# Patient Record
Sex: Male | Born: 1970 | Race: White | Hispanic: No | Marital: Married | Smoking: Never smoker
Health system: Southern US, Community
[De-identification: ages and names within clinical notes are randomized; demographics above are authoritative.]

## PROBLEM LIST (undated history)

## (undated) DIAGNOSIS — Q210 Ventricular septal defect: Secondary | ICD-10-CM

## (undated) DIAGNOSIS — G4733 Obstructive sleep apnea (adult) (pediatric): Secondary | ICD-10-CM

## (undated) HISTORY — DX: Ventricular septal defect: Q21.0

## (undated) HISTORY — DX: Obstructive sleep apnea (adult) (pediatric): G47.33

## (undated) HISTORY — PX: OTHER SURGICAL HISTORY: SHX169

---

## 2013-08-27 ENCOUNTER — Encounter: Payer: Self-pay | Admitting: General Surgery

## 2013-08-27 DIAGNOSIS — G4733 Obstructive sleep apnea (adult) (pediatric): Secondary | ICD-10-CM | POA: Insufficient documentation

## 2013-08-31 ENCOUNTER — Ambulatory Visit (INDEPENDENT_AMBULATORY_CARE_PROVIDER_SITE_OTHER): Payer: BC Managed Care – PPO | Admitting: Cardiology

## 2013-08-31 ENCOUNTER — Encounter: Payer: Self-pay | Admitting: Cardiology

## 2013-08-31 VITALS — BP 129/77 | HR 80 | Ht 66.0 in | Wt 208.0 lb

## 2013-08-31 DIAGNOSIS — G4733 Obstructive sleep apnea (adult) (pediatric): Secondary | ICD-10-CM

## 2013-08-31 DIAGNOSIS — Q210 Ventricular septal defect: Secondary | ICD-10-CM | POA: Insufficient documentation

## 2013-08-31 DIAGNOSIS — Z8774 Personal history of (corrected) congenital malformations of heart and circulatory system: Secondary | ICD-10-CM

## 2013-08-31 DIAGNOSIS — E669 Obesity, unspecified: Secondary | ICD-10-CM

## 2013-08-31 DIAGNOSIS — Z9889 Other specified postprocedural states: Secondary | ICD-10-CM

## 2013-08-31 NOTE — Progress Notes (Signed)
[address] [place], [place]  [address] Phone: [phone]([phone] Fax:  [phone](336) [phone]  Date:  08/31/2013   ID:  Jackson Blackburn, DOB 1970/12/17, MRN [id number]  PCP:  Darrow BussingKOIRALA,DIBAS, MD  Sleep Medicine:  Armanda Magicraci Turner, MD     History of Present Illness: Jackson Blackburn is a 43 y.o. male with a history of OSA who presents today for followup.  He is doing well.  He tolerates his BiPAP ASV device.  He tolerates the mask and feels the pressure is adequate except when he starts to lay down he feels the pressure is not high like it is during the night.  He is on a RAMP protocol.  He has not felt as rested in the am and as felt sleepy during the day recently but has been staying up later.  His download today showed an AHI of 1.3/hr and 98% compliance in using more than 4 hours nightly. He has a history of VSD s/p repair as a child and had an echo several years ago that showed no residual.   Wt Readings from Last 3 Encounters:  08/31/13 208 lb (94.348 kg)     Past Medical History  Diagnosis Date  . VSD (ventricular septal defect)     Congenital  . OSA (obstructive sleep apnea)     Severe    Current Outpatient Prescriptions  Medication Sig Dispense Refill  . acetaminophen (TYLENOL) 325 MG tablet Take 650 mg by mouth every 6 (six) hours as needed (as needed).      . Multiple Vitamin (MULTIVITAMIN) tablet Take 1 tablet by mouth daily.      . Naproxen Sodium (ALEVE) 220 MG CAPS Take by mouth as needed.       No current facility-administered medications for this visit.    Allergies:    Allergies  Allergen Reactions  . Penicillins     unknown but can take amox    Social History:  The patient  reports that he has never smoked. He does not have any smokeless tobacco history on file. He reports that he drinks alcohol. He reports that he does not use illicit drugs.   Family History:  The patient's family history includes Hypertension in his father.   ROS:  Please see the history of  present illness.      All other systems reviewed and negative.   PHYSICAL EXAM: VS:  BP 129/77  Pulse 80  Ht 5\' 6"  (1.676 m)  Wt 208 lb (94.348 kg)  BMI 33.59 kg/m2 Well nourished, well developed, in no acute distress HEENT: normal Neck: no JVD Cardiac:  normal S1, S2; RRR; no murmur Lungs:  clear to auscultation bilaterally, no wheezing, rhonchi or rales Abd: soft, nontender, no hepatomegaly Ext: no edema Skin: warm and dry Neuro:  CNs 2-12 intact, no focal abnormalities noted       ASSESSMENT AND PLAN:  1. Severe OSA on BiPAP ASV and tolerating well.  Download showed adequate AHI on current settings and good compliance 2. Obesity - he has started exercising more on the treadmill.  He is running 2 miles for about 40 minutes at least twice weekly.  I encouraged him to try to increase to 5 days weekly. 3. Daytime sleepiness which is due to not going to bed on time at night.  His AHI is good indicating that his sleep apnea is adequately titrated and not the cause of daytime sleepiness.   4. History of VSD s/p repair with residual  left to right shunt by echo 10/10  Followup with me in  months  Signed, Armanda Magicraci Turner, MD 08/31/2013 10:41 AM

## 2013-08-31 NOTE — Patient Instructions (Signed)
Your physician recommends that you continue on your current medications as directed. Please refer to the Current Medication list given to you today.  Your physician wants you to follow-up in: 6 months with Dr Turner You will receive a reminder letter in the mail two months in advance. If you don't receive a letter, please call our office to schedule the follow-up appointment.  

## 2013-12-29 ENCOUNTER — Encounter: Payer: Self-pay | Admitting: Cardiology

## 2014-01-11 ENCOUNTER — Encounter: Payer: Self-pay | Admitting: Cardiology

## 2014-03-29 ENCOUNTER — Encounter: Payer: Self-pay | Admitting: Cardiology

## 2014-03-29 ENCOUNTER — Ambulatory Visit (INDEPENDENT_AMBULATORY_CARE_PROVIDER_SITE_OTHER): Payer: BC Managed Care – PPO | Admitting: Cardiology

## 2014-03-29 VITALS — BP 128/80 | HR 63 | Ht 68.0 in | Wt 206.0 lb

## 2014-03-29 DIAGNOSIS — E669 Obesity, unspecified: Secondary | ICD-10-CM

## 2014-03-29 DIAGNOSIS — G4733 Obstructive sleep apnea (adult) (pediatric): Secondary | ICD-10-CM

## 2014-03-29 DIAGNOSIS — R079 Chest pain, unspecified: Secondary | ICD-10-CM

## 2014-03-29 DIAGNOSIS — Q210 Ventricular septal defect: Secondary | ICD-10-CM

## 2014-03-29 NOTE — Progress Notes (Signed)
[address] [place], [place]  [address] Phone: [phone] Fax:  [phone]  Date:  03/29/2014   ID:  Jackson Blackburn, DOB February 01, 1971, MRN [id number]  PCP:  Darrow BussingKOIRALA,DIBAS, MD  Cardiologist:  Armanda Magicraci Turner, MD    History of Present Illness: Jackson JulyMichael A Glynn is a 43 y.o. male with a history of OSA who presents today for followup. He is doing well. He tolerates his BiPAP. He tolerates the nasal pillow mask and feels the pressure is adequate.  His download today showed an AHI of 1.2/hr and 99% compliance in using more than 4 hours nightly on 12/8cm H2O.  He feels rested in the am if he has gone to bed on time.   He has a history of VSD s/p repair as a child and had an echo several years ago that showed no residual. He has been having some chest discomfort that is associated with jaw pain.  The chest pain is located in the center of his chest and left chest and radiates into his back and jaw.  It can last up to 30 minutes.  It is nonexertional.  He denies any SOB or diaphoresis but occasionally has some nausea.  He walks on the treadmill without any problems.      Wt Readings from Last 3 Encounters:  03/29/14 206 lb (93.441 kg)  08/31/13 208 lb (94.348 kg)     Past Medical History  Diagnosis Date  . VSD (ventricular septal defect)     Congenital  . OSA (obstructive sleep apnea)     Severe    Current Outpatient Prescriptions  Medication Sig Dispense Refill  . acetaminophen (TYLENOL) 325 MG tablet Take 650 mg by mouth every 6 (six) hours as needed (as needed).    Marland Kitchen. aspirin 81 MG tablet Take 81 mg by mouth daily.    . Multiple Vitamin (MULTIVITAMIN) tablet Take 1 tablet by mouth daily.    . Naproxen Sodium (ALEVE) 220 MG CAPS Take by mouth as needed.     No current facility-administered medications for this visit.    Allergies:    Allergies  Allergen Reactions  . Penicillins     unknown but can take amox    Social History:  The patient  reports that he has never  smoked. He does not have any smokeless tobacco history on file. He reports that he drinks alcohol. He reports that he does not use illicit drugs.   Family History:  The patient's family history includes CVA in his father; Coronary artery disease in his father and paternal uncle; Heart disease in his father and paternal uncle; Hypertension in his father.   ROS:  Please see the history of present illness.      All other systems reviewed and negative.   PHYSICAL EXAM: VS:  BP 128/80 mmHg  Pulse 63  Ht 5\' 8"  (1.727 m)  Wt 206 lb (93.441 kg)  BMI 31.33 kg/m2 Well nourished, well developed, in no acute distress HEENT: normal Neck: no JVD Cardiac:  normal S1, S2; RRR; no murmur Lungs:  clear to auscultation bilaterally, no wheezing, rhonchi or rales Abd: soft, nontender, no hepatomegaly Ext: no edema Skin: warm and dry Neuro:  CNs 2-12 intact, no focal abnormalities noted  EKG:  NSR with sinus arrhythmia with rSR'     ASSESSMENT AND PLAN:  1. Severe OSA on BiPAP and tolerating well. Download showed adequate AHI on current settings and good compliance 2. Obesity - He is exercising  on the treadmill 3. History of VSD s/p repair with residual left to right shunt by echo 10/10 4. Chest pain which is somewhat atypical in that is occurs only at rest and not when he is on the treadmill but it does radiate to his back and jaw.  This also could be GERD with esophageal reflux.  Followup with me in months 6 months   Signed, Armanda Magicraci Turner, MD [facility] 03/29/2014 8:41 AM

## 2014-03-29 NOTE — Patient Instructions (Signed)
Your physician recommends that you continue on your current medications as directed. Please refer to the Current Medication list given to you today.  Your physician has requested that you have an exercise tolerance test. For further information please visit [email or web address]. Please also follow instruction sheet, as given.  Your physician wants you to follow-up in: 6 months with Dr. Turner. You will receive a reminder letter in the mail two months in advance. If you don't receive a letter, please call our office to schedule the follow-up appointment.  

## 2014-05-04 ENCOUNTER — Encounter: Payer: Self-pay | Admitting: *Deleted

## 2014-05-05 ENCOUNTER — Ambulatory Visit (INDEPENDENT_AMBULATORY_CARE_PROVIDER_SITE_OTHER): Payer: BC Managed Care – PPO | Admitting: Physician Assistant

## 2014-05-05 DIAGNOSIS — R079 Chest pain, unspecified: Secondary | ICD-10-CM

## 2014-05-05 NOTE — Progress Notes (Signed)
Exercise Treadmill Test  Pre-Exercise Testing Evaluation Rhythm: normal sinus  Rate: 78 bpm     Test  Exercise Tolerance Test Ordering MD: Armanda Magicraci Turner, MD  Interpreting MD: Tereso NewcomerScott Hendryx Ricke, PA-C  Unique Test No: 1  Treadmill:  1  Indication for ETT: chest pain - rule out ischemia  Contraindication to ETT: No   Stress Modality: exercise - treadmill  Cardiac Imaging Performed: non   Protocol: standard Bruce - maximal  Max BP:  170/81  Max MPHR (bpm):  175 85% MPR (bpm):  149  MPHR obtained (bpm):  166 % MPHR obtained:  94  Reached 85% MPHR (min:sec):  9:34 Total Exercise Time (min-sec):  10:30  Workload in METS:  12.5 Borg Scale: 16  Reason ETT Terminated:  desired heart rate attained    ST Segment Analysis At Rest: non-specific ST segment slurring With Exercise: non-specific ST changes  Other Information Arrhythmia:  No Angina during ETT:  absent (0) Quality of ETT:  diagnostic  ETT Interpretation:  normal - no evidence of ischemia by ST analysis  Comments: Good exercise capacity. No chest pain. Normal BP response to exercise. No ST changes to suggest ischemia.   Recommendations: FU with Dr. Armanda Magicraci Turner as planned. Signed,  Tereso NewcomerScott Keyshia Orwick, PA-C   05/05/2014 10:56 AM

## 2014-07-29 ENCOUNTER — Encounter: Payer: Self-pay | Admitting: Cardiology

## 2014-10-20 NOTE — Progress Notes (Signed)
Cardiology Office Note   Date:  10/21/2014   ID:  Jackson JulyMichael A Saxe, DOB 10/25/1970, MRN [id number]  PCP:  Darrow BussingKOIRALA,DIBAS, MD    Chief Complaint  Patient presents with  . Follow-up    OSA      History of Present Illness: Jackson Blackburn is a 44 y.o. male with a history of OSA who presents today for followup. He is doing well. He tolerates his BiPAP. He tolerates the nasal pillow mask and feels the pressure is adequate.He has been waking up recently with dry mouth and thinks his chin strap is too loose and is getting another one.  He sleeps on his back for the most part.   His download today showed an AHI of 1.4/hr and 99% compliance in using more than 4 hours nightly on 12/8cm H2O. He feels rested in the am if he has gone to bed on time. He has a history of VSD s/p repair as a child and had an echo several years ago that showed no residual.  He ran his first 5K and has noticed that he has been getting some sharp leg pain in his calf in both legs.  He denies any chest pain but had some SOB when running the 5K. He denies any dizziness, palpitations or syncope.    Past Medical History  Diagnosis Date  . VSD (ventricular septal defect)     Congenital  . OSA (obstructive sleep apnea)     Severe    Past Surgical History  Procedure Laterality Date  . No surgical hx       Current Outpatient Prescriptions  Medication Sig Dispense Refill  . acetaminophen (TYLENOL) 325 MG tablet Take 650 mg by mouth every 6 (six) hours as needed (as needed).    Marland Kitchen. aspirin 81 MG tablet Take 81 mg by mouth daily.    . Multiple Vitamin (MULTIVITAMIN) tablet Take 1 tablet by mouth daily.    . Naproxen Sodium (ALEVE) 220 MG CAPS Take by mouth as needed.     No current facility-administered medications for this visit.    Allergies:   Penicillins    Social History:  The patient  reports that he has never smoked. He does not have any smokeless tobacco history on file. He reports that  he drinks alcohol. He reports that he does not use illicit drugs.   Family History:  The patient's family history includes CVA in his father; Coronary artery disease in his father and paternal uncle; Heart disease in his father and paternal uncle; Hypertension in his father.    ROS:  Please see the history of present illness.   Otherwise, review of systems are positive for none.   All other systems are reviewed and negative.    PHYSICAL EXAM: VS:  BP 120/70 mmHg  Pulse 89  Ht 5\' 8"  (1.727 m)  Wt 208 lb (94.348 kg)  BMI 31.63 kg/m2  SpO2 93% , BMI Body mass index is 31.63 kg/(m^2). GEN: Well nourished, well developed, in no acute distress HEENT: normal Neck: no JVD, carotid bruits, or masses Cardiac: RRR; no murmurs, rubs, or gallops,no edema  Respiratory:  clear to auscultation bilaterally, normal work of breathing GI: soft, nontender, nondistended, + BS MS: no deformity or atrophy Skin: warm and dry, no rash Neuro:  Strength and sensation are intact Psych: euthymic mood, full affect   EKG:  EKG is not ordered  today.    Recent Labs: No results found for requested labs within last 365 days.    Lipid Panel No results found for: CHOL, TRIG, HDL, CHOLHDL, VLDL, LDLCALC, LDLDIRECT    Wt Readings from Last 3 Encounters:  10/21/14 208 lb (94.348 kg)  03/29/14 206 lb (93.441 kg)  08/31/13 208 lb (94.348 kg)     ASSESSMENT AND PLAN:  1. Severe OSA on BiPAP and tolerating well. Download showed adequate AHI on current settings and good compliance 2. Obesity - He is exercising on the treadmill 3. History of VSD s/p repair with residual left to right shunt by echo 10/10   4.       LE cramps in legs with exertion ? Claudication.  Also swelling in his calfs.  I will check bilateral LE venous and arterial dopplers   Current medicines are reviewed at length with the patient today.  The patient does not have concerns regarding medicines.  The following changes have been made:   no change  Labs/ tests ordered today: See above Assessment and Plan No orders of the defined types were placed in this encounter.     Disposition:   FU with me in 1 year  Signed, Quintella Reichert, MD  10/21/2014 8:54 AM    [facility] [address] [place], [place], [place]  [address] Phone: [phone]; Fax: [phone]

## 2014-10-21 ENCOUNTER — Ambulatory Visit (INDEPENDENT_AMBULATORY_CARE_PROVIDER_SITE_OTHER): Payer: BLUE CROSS/BLUE SHIELD | Admitting: Cardiology

## 2014-10-21 ENCOUNTER — Encounter: Payer: Self-pay | Admitting: Cardiology

## 2014-10-21 VITALS — BP 120/70 | HR 89 | Ht 68.0 in | Wt 208.0 lb

## 2014-10-21 DIAGNOSIS — E669 Obesity, unspecified: Secondary | ICD-10-CM

## 2014-10-21 DIAGNOSIS — Q210 Ventricular septal defect: Secondary | ICD-10-CM

## 2014-10-21 DIAGNOSIS — G4733 Obstructive sleep apnea (adult) (pediatric): Secondary | ICD-10-CM | POA: Diagnosis not present

## 2014-10-21 DIAGNOSIS — Z9889 Other specified postprocedural states: Secondary | ICD-10-CM

## 2014-10-21 DIAGNOSIS — I739 Peripheral vascular disease, unspecified: Secondary | ICD-10-CM

## 2014-10-21 DIAGNOSIS — M79606 Pain in leg, unspecified: Secondary | ICD-10-CM | POA: Insufficient documentation

## 2014-10-21 DIAGNOSIS — Z8774 Personal history of (corrected) congenital malformations of heart and circulatory system: Secondary | ICD-10-CM

## 2014-10-21 NOTE — Patient Instructions (Signed)
Medication Instructions:  Your physician recommends that you continue on your current medications as directed. Please refer to the Current Medication list given to you today.   Labwork: None  Testing/Procedures: Dr. Mayford Knifeurner recommends you have a lower extremity arterial duplex.  Dr. Mayford Knifeurner recommends you have a lower extremity venous duplex.  Follow-Up: Your physician wants you to follow-up in: 1 year with Dr. Mayford Knifeurner. You will receive a reminder letter in the mail two months in advance. If you don't receive a letter, please call our office to schedule the follow-up appointment.   Any Other Special Instructions Will Be Listed Below (If Applicable).

## 2014-10-26 ENCOUNTER — Other Ambulatory Visit: Payer: Self-pay | Admitting: Cardiology

## 2014-10-26 DIAGNOSIS — M79606 Pain in leg, unspecified: Secondary | ICD-10-CM

## 2014-10-26 DIAGNOSIS — R609 Edema, unspecified: Secondary | ICD-10-CM

## 2014-11-01 ENCOUNTER — Ambulatory Visit (HOSPITAL_COMMUNITY): Payer: BLUE CROSS/BLUE SHIELD | Attending: Cardiology

## 2014-11-01 DIAGNOSIS — M79604 Pain in right leg: Secondary | ICD-10-CM | POA: Diagnosis not present

## 2014-11-01 DIAGNOSIS — M79606 Pain in leg, unspecified: Secondary | ICD-10-CM

## 2014-11-01 DIAGNOSIS — M79605 Pain in left leg: Secondary | ICD-10-CM | POA: Diagnosis not present

## 2014-11-01 DIAGNOSIS — R609 Edema, unspecified: Secondary | ICD-10-CM | POA: Diagnosis not present

## 2014-11-02 ENCOUNTER — Ambulatory Visit (HOSPITAL_COMMUNITY): Payer: BLUE CROSS/BLUE SHIELD | Attending: Cardiology

## 2014-11-02 DIAGNOSIS — I739 Peripheral vascular disease, unspecified: Secondary | ICD-10-CM

## 2014-11-02 DIAGNOSIS — M79662 Pain in left lower leg: Secondary | ICD-10-CM | POA: Diagnosis not present

## 2014-11-02 DIAGNOSIS — M79606 Pain in leg, unspecified: Secondary | ICD-10-CM | POA: Diagnosis not present

## 2014-11-02 DIAGNOSIS — M79661 Pain in right lower leg: Secondary | ICD-10-CM | POA: Insufficient documentation

## 2014-11-03 ENCOUNTER — Encounter: Payer: Self-pay | Admitting: Cardiology

## 2014-11-07 ENCOUNTER — Other Ambulatory Visit: Payer: Self-pay | Admitting: Cardiology

## 2014-11-07 DIAGNOSIS — I739 Peripheral vascular disease, unspecified: Secondary | ICD-10-CM

## 2014-11-08 ENCOUNTER — Ambulatory Visit (HOSPITAL_COMMUNITY): Payer: BLUE CROSS/BLUE SHIELD | Attending: Cardiology

## 2014-11-08 DIAGNOSIS — I739 Peripheral vascular disease, unspecified: Secondary | ICD-10-CM | POA: Insufficient documentation

## 2014-11-17 ENCOUNTER — Telehealth: Payer: Self-pay

## 2014-11-17 NOTE — Telephone Encounter (Signed)
Informed patient of results and verbal understanding expressed.   Message sent to scheduling to make appointment with Dr. Kirke CorinArida or Dr. Allyson SabalBerry.

## 2014-11-17 NOTE — Telephone Encounter (Signed)
-----   Message from Quintella Reichertraci R Turner, MD sent at 11/16/2014 10:25 PM EDT ----- Please refer to Dr. Kirke CorinArida for Dr. Allyson SabalBerry for further evaluation with angiography

## 2014-12-06 ENCOUNTER — Institutional Professional Consult (permissible substitution): Payer: BLUE CROSS/BLUE SHIELD | Admitting: Cardiovascular Disease

## 2014-12-13 ENCOUNTER — Encounter: Payer: Self-pay | Admitting: Cardiovascular Disease

## 2014-12-13 ENCOUNTER — Ambulatory Visit (INDEPENDENT_AMBULATORY_CARE_PROVIDER_SITE_OTHER): Payer: BLUE CROSS/BLUE SHIELD | Admitting: Cardiovascular Disease

## 2014-12-13 VITALS — BP 116/90 | HR 78 | Ht 66.0 in | Wt 214.8 lb

## 2014-12-13 DIAGNOSIS — I739 Peripheral vascular disease, unspecified: Secondary | ICD-10-CM | POA: Diagnosis not present

## 2014-12-13 NOTE — Progress Notes (Signed)
Primary cardiologist: Dr. Mayford Knife  HPI  Jackson Blackburn is a 44 y.o. male who was referred by Dr. Mayford Knife for evaluation of claudication. He has known history of VSD repair in 1974. He was 44 years old at that time and required catheterization via the right femoral artery. He has a scar in that area. Other medical problems include obesity and obstructive sleep apnea.  The patient has been running for exercise and weight loss. In May, he started training for a 5K race and noticed bilateral calf cramping with running more noticeable on the right side than the left side. He was ultimately able to finish the 5K race but it required him 56 minutes and he had to walk most of it. After the race, he had persistent calf discomfort for 15-30 minutes. He has no rest pain or lower extremity ulceration. He underwent noninvasive evaluation which showed normal ABI bilaterally. 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  Allergies  Allergen Reactions  . Penicillins     PER NW:GNFAOZH but can take amox     Current Outpatient Prescriptions on File Prior to Visit  Medication Sig Dispense Refill  . acetaminophen (TYLENOL) 325 MG tablet Take 650 mg by mouth every 6 (six) hours as needed for mild pain, moderate pain, fever or headache (as needed).     Marland Kitchen aspirin 81 MG tablet Take 81 mg by mouth daily.    . Multiple Vitamin (MULTIVITAMIN) tablet Take 1 tablet by mouth daily.    . Naproxen Sodium (ALEVE) 220 MG CAPS Take 1 capsule by mouth daily as needed (for pain).      No current facility-administered medications on file prior to visit.     Past Medical History  Diagnosis Date  . VSD (ventricular septal defect)     Congenital  . OSA (obstructive sleep apnea)     Severe     Past Surgical History  Procedure Laterality Date  . No surgical hx       Family History  Problem Relation Age of Onset  . Hypertension Father   . CVA Father   . Heart disease Father   .  Coronary artery disease Father   . Heart disease Paternal Uncle   . Coronary artery disease Paternal Uncle      History   Social History  . Marital Status: Married    Spouse Name: N/A  . Number of Children: N/A  . Years of Education: N/A   Occupational History  . Not on file.   Social History Main Topics  . Smoking status: Never Smoker   . Smokeless tobacco: Not on file  . Alcohol Use: Yes     Comment: rare  . Drug Use: No  . Sexual Activity: Not on file   Other Topics Concern  . Not on file   Social History Narrative     ROS A 10 point review of system was performed. It is negative other than that mentioned in the history of present illness.   PHYSICAL EXAM   BP 116/90 mmHg  Pulse 78  Ht  (1.676 m)  Wt 214 lb 12.8 oz (97.433 kg)  BMI 34.69 kg/m2 Constitutional: He is oriented to person, place, and time. He appears well-developed and well-nourished. No distress.  HENT: No nasal discharge.  Head: Normocephalic and atraumatic.  Eyes: Pupils are equal and round.  No discharge. Neck: Normal range of motion. Neck supple. No JVD present. No thyromegaly present.  Cardiovascular:  Normal rate, regular rhythm, normal heart sounds. Exam reveals no gallop and no friction rub. No murmur heard.  Pulmonary/Chest: Effort normal and breath sounds normal. No stridor. No respiratory distress. He has no wheezes. He has no rales. He exhibits no tenderness.  Abdominal: Soft. Bowel sounds are normal. He exhibits no distension. There is no tenderness. There is no rebound and no guarding.  Musculoskeletal: Normal range of motion. He exhibits no edema and no tenderness.  Neurological: He is alert and oriented to person, place, and time. Coordination normal.  Skin: Skin is warm and dry. No rash noted. He is not diaphoretic. No erythema. No pallor.  Psychiatric: He has a normal mood and affect. His behavior is normal. Judgment and thought content normal.  Vascular: Radial pulses are  normal bilaterally. Femoral: +1 on the right side and +2 on the left side. There is a scar in the right groin from previous procedure. Posterior tibial: +1 on the right and +2 on the left. Dorsalis pedis is +1 on the right and +2 on the left.     EKG: Normal sinus rhythm with left atrial enlargement, incomplete right bundle branch block.   ASSESSMENT AND PLAN

## 2014-12-13 NOTE — Patient Instructions (Addendum)
Medication Instructions:  Your physician recommends that you continue on your current medications as directed. Please refer to the Current Medication list given to you today.  Labwork: No new orders.   Testing/Procedures: No new orders.   Follow-Up: Please call the office at [phone] when you are ready to schedule CT angiogram.   The pt will need a BMP drawn prior to having this test scheduled.   Any Other Special Instructions Will Be Listed Below (If Applicable).

## 2014-12-13 NOTE — Assessment & Plan Note (Signed)
The patient has mild bilateral calf claudication worse on the right side. He has minimal risk factors for atherosclerosis and this does not seem to be the etiology for his presentation. 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 This is the site where he has a surgical scar and I suspect that he had right SFA trauma during previous catheterizations for his VSD repair. In order to better visualize this, I recommended proceeding with either CT angiography or invasive angiography. The patient is not able to proceed with any of these test due to cost reasons. Obviously, his symptoms are mild and he does not seem to be significantly limited. His ABI was also normal and thus this is not urgent. He is going to notify me once he is ready to schedule CT angiography of the lower extremities or if his symptoms worsen. In the meanwhile, he can continue his exercise program to improve collateral flow.

## 2015-01-16 ENCOUNTER — Telehealth: Payer: Self-pay | Admitting: *Deleted

## 2015-01-16 NOTE — Telephone Encounter (Signed)
Pt calling stating he lost some paper work for tests we had asked him to do He thinks it was for a CT  Please call patient. Pt will like to pick it up.

## 2015-01-16 NOTE — Telephone Encounter (Signed)
Forward to [place] 

## 2015-01-17 NOTE — Telephone Encounter (Signed)
CTA LE BILAT = 73706  73706 FOR BOTH LT AND RT   PV ANGIO [id number]   I left the pt a detailed message on his voicemail with CPT codes.

## 2015-01-17 NOTE — Telephone Encounter (Signed)
I spoke with the pt and he lost his paperwork from his office visit with Dr Kirke Corin. The pt is still trying to determine if he would like to proceed with vascular evaluation. The pt would to contact his insurance to discuss the cost of possible CT angiogram vs invasive angiogram.  I made the pt aware that I will contact him with CPT codes.

## 2015-01-19 ENCOUNTER — Telehealth: Payer: Self-pay | Admitting: Cardiovascular Disease

## 2015-01-19 NOTE — Telephone Encounter (Signed)
New message      Need to verify codes with Lauren again

## 2015-01-19 NOTE — Telephone Encounter (Signed)
I spoke with the pt and made him aware of CPT codes again.  The pt would like me to double check that these are correct.  I will speak with the billing department and call the pt back when I get an answer.

## 2015-01-20 NOTE — Telephone Encounter (Signed)
I left the pt a detailed message in regards to CPT codes.    CTA LE BILAT = 73706  73706 FOR BOTH LT AND RT   PV ANGIO [id number]   These codes are correct.

## 2015-01-24 ENCOUNTER — Telehealth: Payer: Self-pay | Admitting: Cardiovascular Disease

## 2015-01-24 DIAGNOSIS — I739 Peripheral vascular disease, unspecified: Secondary | ICD-10-CM

## 2015-01-24 NOTE — Telephone Encounter (Signed)
You can go ahead a schedule an abdominal aortogram, LE run off and possible angioplasty to be done by me on a Wedensday.

## 2015-01-24 NOTE — Telephone Encounter (Signed)
New message     Pt calling in regards to procedure that he is trying to set up Pt has questions about a code that nurse gave him for insurance purposes to see if procedure would be approved

## 2015-01-24 NOTE — Telephone Encounter (Signed)
No need for CXR

## 2015-01-24 NOTE — Telephone Encounter (Signed)
Dr Fredrik Cove was going to come in for pre procedure labs--BMET, CBCd, PT/INR. It does not look like he has had a chest xray, do you usually get a chest xray pre procedure also?

## 2015-01-24 NOTE — Telephone Encounter (Signed)
Pt states he has decided to schedule PV angiogram instead of LE CTA. Pt states since office visit with Dr Kirke Corin 12/13/14  his symptoms, especially in his right leg, may have gotten worse with exercise.   Pt advised I will forward to Dr Kirke Corin to review for [facility] and date to schedule PV angiogram.

## 2015-01-25 ENCOUNTER — Encounter: Payer: Self-pay | Admitting: *Deleted

## 2015-01-25 NOTE — Telephone Encounter (Signed)
Pt advised he has been scheduled for abdominal aortogram with LE runoff and possible angioplasty 02/01/15 at 11:30AM, arrive 9:30AM--pre procedure lab, BMET/CBCd/PT/INR, scheduled for 01/27/15.  Pt instructions given over the telephone, will mail copy of instructions to pt, leave copy of instructions for pt at front desk.

## 2015-01-27 ENCOUNTER — Other Ambulatory Visit (INDEPENDENT_AMBULATORY_CARE_PROVIDER_SITE_OTHER): Payer: BLUE CROSS/BLUE SHIELD | Admitting: *Deleted

## 2015-01-27 DIAGNOSIS — I739 Peripheral vascular disease, unspecified: Secondary | ICD-10-CM

## 2015-01-27 LAB — PROTIME-INR
INR: 1 ratio (ref 0.8–1.0)
PROTHROMBIN TIME: 11.6 s (ref 9.6–13.1)

## 2015-01-27 LAB — BASIC METABOLIC PANEL
BUN: 15 mg/dL (ref 6–23)
CHLORIDE: 104 meq/L (ref 96–112)
CO2: 29 meq/L (ref 19–32)
CREATININE: 1.2 mg/dL (ref 0.40–1.50)
Calcium: 10.8 mg/dL — ABNORMAL HIGH (ref 8.4–10.5)
GFR: 69.18 mL/min (ref >60.00)
Glucose, Bld: 109 mg/dL — ABNORMAL HIGH (ref 70–99)
Potassium: 4.3 mEq/L (ref 3.5–5.1)
SODIUM: 138 meq/L (ref 135–145)

## 2015-01-27 LAB — CBC WITH DIFFERENTIAL/PLATELET
BASOS ABS: 0 10*3/uL (ref 0.0–0.1)
BASOS PCT: 0.7 % (ref 0.0–3.0)
EOS ABS: 0.2 10*3/uL (ref 0.0–0.7)
Eosinophils Relative: 2.5 % (ref 0.0–5.0)
HCT: 48 % (ref 39.0–52.0)
HEMOGLOBIN: 16.7 g/dL (ref 13.0–17.0)
Lymphocytes Relative: 27.7 % (ref 12.0–46.0)
Lymphs Abs: 1.8 10*3/uL (ref 0.7–4.0)
MCHC: 34.8 g/dL (ref 30.0–36.0)
MCV: 88.4 fl (ref 78.0–100.0)
MONOS PCT: 6.2 % (ref 3.0–12.0)
Monocytes Absolute: 0.4 10*3/uL (ref 0.1–1.0)
NEUTROS ABS: 4 10*3/uL (ref 1.4–7.7)
Neutrophils Relative %: 62.9 % (ref 43.0–77.0)
PLATELETS: 206 10*3/uL (ref 150.0–400.0)
RBC: 5.43 Mil/uL (ref 4.22–5.81)
RDW: 13.3 % (ref 11.5–15.5)
WBC: 6.4 10*3/uL (ref 4.0–10.5)

## 2015-02-01 ENCOUNTER — Ambulatory Visit (HOSPITAL_COMMUNITY)
Admission: RE | Admit: 2015-02-01 | Discharge: 2015-02-01 | Disposition: A | Discharge status: Home / Self Care | Payer: BLUE CROSS/BLUE SHIELD | Source: Ambulatory Visit | Attending: Cardiovascular Disease | Admitting: Cardiovascular Disease

## 2015-02-01 ENCOUNTER — Encounter (HOSPITAL_COMMUNITY)
Admission: RE | Disposition: A | Discharge status: Home / Self Care | Payer: Self-pay | Source: Ambulatory Visit | Attending: Cardiovascular Disease

## 2015-02-01 DIAGNOSIS — I70211 Atherosclerosis of native arteries of extremities with intermittent claudication, right leg: Secondary | ICD-10-CM | POA: Diagnosis not present

## 2015-02-01 DIAGNOSIS — G4733 Obstructive sleep apnea (adult) (pediatric): Secondary | ICD-10-CM | POA: Insufficient documentation

## 2015-02-01 DIAGNOSIS — Z7982 Long term (current) use of aspirin: Secondary | ICD-10-CM | POA: Insufficient documentation

## 2015-02-01 DIAGNOSIS — Z880 Allergy status to penicillin: Secondary | ICD-10-CM | POA: Diagnosis not present

## 2015-02-01 DIAGNOSIS — E669 Obesity, unspecified: Secondary | ICD-10-CM | POA: Diagnosis not present

## 2015-02-01 HISTORY — PX: PERIPHERAL VASCULAR CATHETERIZATION: SHX172C

## 2015-02-01 SURGERY — ABDOMINAL AORTOGRAM

## 2015-02-01 MED ORDER — LIDOCAINE HCL (PF) 1 % IJ SOLN
INTRAMUSCULAR | Status: AC
  Filled 2015-02-01: qty 30

## 2015-02-01 MED ORDER — ASPIRIN 81 MG PO CHEW
CHEWABLE_TABLET | ORAL | Status: AC
  Administered 2015-02-01: 81 mg via ORAL
  Filled 2015-02-01: qty 1

## 2015-02-01 MED ORDER — FENTANYL CITRATE (PF) 100 MCG/2ML IJ SOLN
INTRAMUSCULAR | Status: AC
  Filled 2015-02-01: qty 4

## 2015-02-01 MED ORDER — SODIUM CHLORIDE 0.9 % IV SOLN: INTRAVENOUS | Status: AC

## 2015-02-01 MED ORDER — LIDOCAINE HCL (PF) 1 % IJ SOLN
INTRAMUSCULAR | Status: DC | PRN
  Administered 2015-02-01: 25 mL

## 2015-02-01 MED ORDER — ASPIRIN 81 MG PO CHEW
81.0000 mg | CHEWABLE_TABLET | ORAL | Status: AC
  Administered 2015-02-01: 81 mg via ORAL

## 2015-02-01 MED ORDER — CILOSTAZOL 50 MG PO TABS: 50.0000 mg | ORAL_TABLET | Freq: Two times a day (BID) | ORAL | Status: DC

## 2015-02-01 MED ORDER — SODIUM CHLORIDE 0.9 % WEIGHT BASED INFUSION
3.0000 mL/kg/h | INTRAVENOUS | Status: AC
  Administered 2015-02-01: 3 mL/kg/h via INTRAVENOUS

## 2015-02-01 MED ORDER — MIDAZOLAM HCL 2 MG/2ML IJ SOLN
INTRAMUSCULAR | Status: AC
  Filled 2015-02-01: qty 4

## 2015-02-01 MED ORDER — MIDAZOLAM HCL 2 MG/2ML IJ SOLN
INTRAMUSCULAR | Status: DC | PRN
  Administered 2015-02-01: 1 mg via INTRAVENOUS

## 2015-02-01 MED ORDER — HEPARIN (PORCINE) IN NACL 2-0.9 UNIT/ML-% IJ SOLN
INTRAMUSCULAR | Status: AC
  Filled 2015-02-01: qty 1000

## 2015-02-01 MED ORDER — SODIUM CHLORIDE 0.9 % IJ SOLN: 3.0000 mL | INTRAMUSCULAR | Status: DC | PRN

## 2015-02-01 MED ORDER — IODIXANOL 320 MG/ML IV SOLN
INTRAVENOUS | Status: DC | PRN
  Administered 2015-02-01: 75 mL via INTRA_ARTERIAL

## 2015-02-01 MED ORDER — SODIUM CHLORIDE 0.9 % WEIGHT BASED INFUSION: 1.0000 mL/kg/h | INTRAVENOUS | Status: DC

## 2015-02-01 MED ORDER — FENTANYL CITRATE (PF) 100 MCG/2ML IJ SOLN
INTRAMUSCULAR | Status: DC | PRN
  Administered 2015-02-01: 50 ug via INTRAVENOUS

## 2015-02-01 MED ORDER — SODIUM CHLORIDE 0.9 % IJ SOLN: 3.0000 mL | Freq: Two times a day (BID) | INTRAMUSCULAR | Status: DC

## 2015-02-01 MED ORDER — SODIUM CHLORIDE 0.9 % IV SOLN: 250.0000 mL | INTRAVENOUS | Status: DC | PRN

## 2015-02-01 SURGICAL SUPPLY — 7 items
CATH ANGIO 5F PIGTAIL 65CM (CATHETERS) ×3 IMPLANT
KIT PV (KITS) ×3 IMPLANT
SHEATH PINNACLE 5F 10CM (SHEATH) ×3 IMPLANT
SYRINGE MEDRAD AVANTA MACH 7 (SYRINGE) ×3 IMPLANT
TRANSDUCER W/STOPCOCK (MISCELLANEOUS) ×3 IMPLANT
TRAY PV CATH (CUSTOM PROCEDURE TRAY) ×3 IMPLANT
WIRE HITORQ VERSACORE ST 145CM (WIRE) ×3 IMPLANT

## 2015-02-01 NOTE — Progress Notes (Signed)
Site area: left groin fa sheath Site Prior to Removal:  Level  0 Pressure Applied For:  20 minutes Manual:   yes Patient Status During Pull:  stable Post Pull Site:  Level  0 Post Pull Instructions Given:  yes Post Pull Pulses Present: yes Dressing Applied:  tegaderm Bedrest begins @  1355 Comments:  0

## 2015-02-01 NOTE — Discharge Instructions (Signed)
Start Pletal 50 mg twice daily ( a prescription was sent to [facility]) Angiogram, Care After Refer to this sheet in the next few weeks. These instructions provide you with information on caring for yourself after your procedure. Your health care provider may also give you more specific instructions. Your treatment has been planned according to current medical practices, but problems sometimes occur. Call your health care provider if you have any problems or questions after your procedure.  WHAT TO EXPECT AFTER THE PROCEDURE After your procedure, it is typical to have the following sensations:  Minor discomfort or tenderness and a small bump at the catheter insertion site. The bump should usually decrease in size and tenderness within 1 to 2 weeks.  Any bruising will usually fade within 2 to 4 weeks. HOME CARE INSTRUCTIONS   You may need to keep taking blood thinners if they were prescribed for you. Take medicines only as directed by your health care provider.  Do not apply powder or lotion to the site.  Do not take baths, swim, or use a hot tub until your health care provider approves.  You may shower 24 hours after the procedure. Remove the bandage (dressing) and gently wash the site with plain soap and water. Gently pat the site dry.  Inspect the site at least twice daily.  Limit your activity for the first 48 hours. Do not bend, squat, or lift anything over 20 lb (9 kg) or as directed by your health care provider.  Plan to have someone take you home after the procedure. Follow instructions about when you can drive or return to work. SEEK MEDICAL CARE IF:  You get light-headed when standing up.  You have drainage (other than a small amount of blood on the dressing).  You have chills.  You have a fever.  You have redness, warmth, swelling, or pain at the insertion site. SEEK IMMEDIATE MEDICAL CARE IF:   You develop chest pain or shortness of breath, feel faint, or pass  out.  You have bleeding, swelling larger than a walnut, or drainage from the catheter insertion site.  You develop pain, discoloration, coldness, or severe bruising in the leg or arm that held the catheter.  You develop bleeding from any other place, such as the bowels. You may see bright red blood in your urine or stools, or your stools may appear black and tarry.  You have heavy bleeding from the site. If this happens, hold pressure on the site. CALL 911 MAKE SURE YOU:  Understand these instructions.  Will watch your condition.  Will get help right away if you are not doing well or get worse. Document Released: 11/22/2004 Document Revised: 09/20/2013 Document Reviewed: 09/28/2012 [facility] Patient Information 2015 [place], [place]. This information is not intended to replace advice given to you by your health care provider. Make sure you discuss any questions you have with your health care provider.

## 2015-02-01 NOTE — H&P (Signed)
Primary cardiologist: Dr. Mayford Knife  HPI  Jackson Blackburn is a 44 y.o. male who Is here today for lower extremity angiography. He was seen recently for right calf claudication. He has known history of VSD repair in 1974. He was 44 years old at that time and required catheterization via the right femoral artery. He has a scar in that area. Other medical problems include obesity and obstructive sleep apnea.  The patient has been running for exercise and weight loss. In May, he started training for a 5K race and noticed bilateral calf cramping with running more noticeable on the right side than the left side. He was ultimately able to finish the 5K race but it required him 56 minutes and he had to walk most of it. After the race, he had persistent calf discomfort for 15-30 minutes. He has no rest pain or lower extremity ulceration. He underwent noninvasive evaluation which showed normal ABI bilaterally. 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 He has been trying to run for exercise. However, he has been limited by claudication.  Allergies  Allergen Reactions  . Penicillins     PER ZO:XWRUEAV but can take amox     Current Outpatient Prescriptions on File Prior to Visit  Medication Sig Dispense Refill  . acetaminophen (TYLENOL) 325 MG tablet Take 650 mg by mouth every 6 (six) hours as needed for mild pain, moderate pain, fever or headache (as needed).     Marland Kitchen aspirin 81 MG tablet Take 81 mg by mouth daily.    . Multiple Vitamin (MULTIVITAMIN) tablet Take 1 tablet by mouth daily.    . Naproxen Sodium (ALEVE) 220 MG CAPS Take 1 capsule by mouth daily as needed (for pain).      No current facility-administered medications on file prior to visit.     Past Medical History  Diagnosis Date  . VSD (ventricular septal defect)     Congenital  . OSA (obstructive sleep apnea)     Severe     Past  Surgical History  Procedure Laterality Date  . No surgical hx       Family History  Problem Relation Age of Onset  . Hypertension Father   . CVA Father   . Heart disease Father   . Coronary artery disease Father   . Heart disease Paternal Uncle   . Coronary artery disease Paternal Uncle      History   Social History  . Marital Status: Married    Spouse Name: N/A  . Number of Children: N/A  . Years of Education: N/A   Occupational History  . Not on file.   Social History Main Topics  . Smoking status: Never Smoker   . Smokeless tobacco: Not on file  . Alcohol Use: Yes     Comment: rare  . Drug Use: No  . Sexual Activity: Not on file   Other Topics Concern  . Not on file   Social History Narrative     ROS A 10 point review of system was performed. It is negative other than that mentioned in the history of present illness.   PHYSICAL EXAM   BP 116/90 mmHg  Pulse 78  Ht 5\' 6"  (1.676 m)  Wt 214 lb 12.8 oz (97.433 kg)  BMI 34.69 kg/m2 Constitutional: He is oriented to person, place, and time. He appears well-developed and well-nourished. No distress.  HENT: No nasal discharge.  Head: Normocephalic and atraumatic.  Eyes: Pupils are  equal and round. No discharge. Neck: Normal range of motion. Neck supple. No JVD present. No thyromegaly present.  Cardiovascular: Normal rate, regular rhythm, normal heart sounds. Exam reveals no gallop and no friction rub. No murmur heard.  Pulmonary/Chest: Effort normal and breath sounds normal. No stridor. No respiratory distress. He has no wheezes. He has no rales. He exhibits no tenderness.  Abdominal: Soft. Bowel sounds are normal. He exhibits no distension. There is no tenderness. There is no rebound and no guarding.  Musculoskeletal: Normal range of motion. He exhibits no edema and no tenderness.  Neurological: He is alert  and oriented to person, place, and time. Coordination normal.  Skin: Skin is warm and dry. No rash noted. He is not diaphoretic. No erythema. No pallor.  Psychiatric: He has a normal mood and affect. His behavior is normal. Judgment and thought content normal.  Vascular: Radial pulses are normal bilaterally. Femoral: +1 on the right side and +2 on the left side. There is a scar in the right groin from previous procedure. Posterior tibial: +1 on the right and +2 on the left. Dorsalis pedis is +1 on the right and +2 on the left.     EKG: Normal sinus rhythm with left atrial enlargement, incomplete right bundle branch block.   ASSESSMENT AND PLAN              Intermittent claudication - Iran Ouch, MD       Status: Written Related Problem: Intermittent claudication   Expand All Collapse All   The patient continues to have mild bilateral calf claudication worse on the right side. He has minimal risk factors for atherosclerosis and this does not seem to be the etiology for his presentation. 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 This is the site where he has a surgical scar and I suspect that he had right SFA trauma during previous catheterizations for his VSD repair. In order to better visualize this, the plan is to proceed with abdominal aortogram and lower extremity angiography. I answered all his questions.

## 2015-02-02 ENCOUNTER — Encounter (HOSPITAL_COMMUNITY): Payer: Self-pay | Admitting: Cardiovascular Disease

## 2015-02-06 ENCOUNTER — Telehealth: Payer: Self-pay | Admitting: Cardiovascular Disease

## 2015-02-06 NOTE — Telephone Encounter (Signed)
I spoke with the pt and made him aware that he is okay to take Tylenol as needed (pt had headache over the weekend and was afraid to take any medication).  The pt said he had stopped taking Aspirin about a week ago. I advised him that he needs to restart ASA  daily and continue Pletal.  The pt also has bruising at his groin area.  No swelling or active bleeding at site.  I advised the pt to contact our office if he has any swelling or pain at site. Pt agreed with plan.

## 2015-02-06 NOTE — Telephone Encounter (Signed)
New Message  Pt calling about possibly taking Tylenol and if there is any medication conflicts- especially since recent hospital stay. Please call back and discuss.

## 2015-02-21 ENCOUNTER — Ambulatory Visit (INDEPENDENT_AMBULATORY_CARE_PROVIDER_SITE_OTHER): Payer: BLUE CROSS/BLUE SHIELD | Admitting: Cardiovascular Disease

## 2015-02-21 ENCOUNTER — Encounter: Payer: Self-pay | Admitting: Cardiovascular Disease

## 2015-02-21 VITALS — BP 118/86 | HR 80 | Ht 68.0 in | Wt 208.0 lb

## 2015-02-21 DIAGNOSIS — I739 Peripheral vascular disease, unspecified: Secondary | ICD-10-CM | POA: Diagnosis not present

## 2015-02-21 NOTE — Patient Instructions (Signed)
Your physician recommends that you continue on your current medications as directed. Please refer to the Current Medication list given to you today.  Your physician wants you to follow-up in: 1 YEAR WITH DR. Kirke Corin.  You will receive a reminder letter in the mail two months in advance. If you don't receive a letter, please call our office to schedule the follow-up appointment.

## 2015-02-21 NOTE — Progress Notes (Signed)
Primary cardiologist: Dr. Mayford Knife  HPI  Jackson Blackburn is a 44 y.o. male who is here today for a follow-up visit regarding  claudication. He has known history of VSD repair in 1974. He was 44 years old at that time and required catheterization via the right femoral artery. He has a scar in that area. Other medical problems include obesity and obstructive sleep apnea.  He was seen for right calf claudication. He underwent noninvasive evaluation which showed normal ABI bilaterally. 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 I proceeded with angiography which showed flush occlusion of the right SFA with reconstitution in the midsegment. Extensive collaterals from the profunda. There was overall normal significant atherosclerosis noted. I started him on small dose cilostazol with improvement.  Allergies  Allergen Reactions  . Penicillins     PER ZO:XWRUEAV but can take amox     Current Outpatient Prescriptions on File Prior to Visit  Medication Sig Dispense Refill  . aspirin 81 MG tablet Take 81 mg by mouth daily.    . cilostazol (PLETAL) 50 MG tablet Take 1 tablet (50 mg total) by mouth 2 (two) times daily. 60 tablet 2  . Multiple Vitamin (MULTIVITAMIN) tablet Take 1 tablet by mouth daily.     No current facility-administered medications on file prior to visit.     Past Medical History  Diagnosis Date  . VSD (ventricular septal defect)     Congenital  . OSA (obstructive sleep apnea)     Severe     Past Surgical History  Procedure Laterality Date  . No surgical hx    . Peripheral vascular catheterization N/A 02/01/2015    Procedure: Abdominal Aortogram;  Surgeon: Iran Ouch, MD;  Location: [facility];  Service: Cardiovascular;  Laterality: N/A;     Family History  Problem Relation Age of Onset  . Hypertension Father   . CVA Father   . Heart disease Father   . Coronary artery disease Father   . Heart disease Paternal  Uncle   . Coronary artery disease Paternal Uncle      Social History   Social History  . Marital Status: Married    Spouse Name: N/A  . Number of Children: N/A  . Years of Education: N/A   Occupational History  . Not on file.   Social History Main Topics  . Smoking status: Never Smoker   . Smokeless tobacco: Not on file  . Alcohol Use: Yes     Comment: rare  . Drug Use: No  . Sexual Activity: Not on file   Other Topics Concern  . Not on file   Social History Narrative     ROS A 10 point review of system was performed. It is negative other than that mentioned in the history of present illness.   PHYSICAL EXAM   BP 118/86 mmHg  Pulse 80  Ht  (1.727 m)  Wt 208 lb (94.348 kg)  BMI 31.63 kg/m2 Constitutional: He is oriented to person, place, and time. He appears well-developed and well-nourished. No distress.  HENT: No nasal discharge.  Head: Normocephalic and atraumatic.  Eyes: Pupils are equal and round.  No discharge. Neck: Normal range of motion. Neck supple. No JVD present. No thyromegaly present.  Cardiovascular: Normal rate, regular rhythm, normal heart sounds. Exam reveals no gallop and no friction rub. No murmur heard.  Pulmonary/Chest: Effort normal and breath sounds normal. No stridor. No respiratory distress. He has  no wheezes. He has no rales. He exhibits no tenderness.  Abdominal: Soft. Bowel sounds are normal. He exhibits no distension. There is no tenderness. There is no rebound and no guarding.  Musculoskeletal: Normal range of motion. He exhibits no edema and no tenderness.  Neurological: He is alert and oriented to person, place, and time. Coordination normal.  Skin: Skin is warm and dry. No rash noted. He is not diaphoretic. No erythema. No pallor.  Psychiatric: He has a normal mood and affect. His behavior is normal. Judgment and thought content normal.  Vascular: Radial pulses are normal bilaterally. Femoral: +1 on the right side and +2 on  the left side. There is a scar in the right groin from previous procedure. Posterior tibial: +1 on the right and +2 on the left. Dorsalis pedis is +1 on the right and +2 on the left. No groin hematoma   ASSESSMENT AND PLAN

## 2015-02-24 NOTE — Assessment & Plan Note (Signed)
The patient has flush occlusion of the right SFA with extensive collaterals. Given the lack of significant atherosclerosis or risk factors, it is highly possible that this might have been a complication of previous procedures related to congenital heart disease. I recommend continuing treatment with small dose cilostazol. Endovascular intervention is not recommended. Symptoms are not severe enough to warrant surgical revascularization.

## 2015-05-16 ENCOUNTER — Other Ambulatory Visit: Payer: Self-pay

## 2015-05-16 MED ORDER — CILOSTAZOL 50 MG PO TABS: 50.0000 mg | ORAL_TABLET | Freq: Two times a day (BID) | ORAL | Status: DC

## 2015-10-31 ENCOUNTER — Ambulatory Visit: Payer: BLUE CROSS/BLUE SHIELD | Admitting: Cardiology

## 2015-11-02 ENCOUNTER — Encounter: Payer: Self-pay | Admitting: Cardiology

## 2015-11-02 ENCOUNTER — Ambulatory Visit (INDEPENDENT_AMBULATORY_CARE_PROVIDER_SITE_OTHER): Payer: BC Managed Care – PPO | Admitting: Cardiology

## 2015-11-02 VITALS — BP 120/79 | HR 94 | Ht 68.0 in | Wt 214.0 lb

## 2015-11-02 DIAGNOSIS — Q210 Ventricular septal defect: Secondary | ICD-10-CM

## 2015-11-02 DIAGNOSIS — E669 Obesity, unspecified: Secondary | ICD-10-CM | POA: Diagnosis not present

## 2015-11-02 DIAGNOSIS — G4733 Obstructive sleep apnea (adult) (pediatric): Secondary | ICD-10-CM

## 2015-11-02 NOTE — Progress Notes (Signed)
Cardiology Office Note    Date:  11/02/2015   ID:  Jackson Blackburn, DOB 1971-04-26, MRN [id number]  PCP:  Darrow BussingKOIRALA,DIBAS, MD  Cardiologist:  Armanda Magicraci Turner, MD   Chief Complaint  Patient presents with  . Sleep Apnea    History of Present Illness:  Jackson Blackburn is a 45 y.o. male with a history of OSA who presents today for followup. He is doing well. He tolerates his BiPAP. He tolerates the nasal pillow mask and feels the pressure is adequate.He feels rested in the am if he has gone to bed on time.He occasionally has a dry mouth and will take a drink of water at night.  He has gotten a new job and is now exercising as much and has been gaining weight and staying up too late. He has a history of VSD s/p repair as a child and had an echo several years ago that showed no residual.He denies any chest pain,  Dizziness, irregular heart beat or syncope. He has noticed some DOE with power walks [facility] [employer] but has gained a few pounds.  He did a 5K recently and felt more exhausted but had not trained any.      Past Medical History  Diagnosis Date  . VSD (ventricular septal defect)     Congenital  . OSA (obstructive sleep apnea)     Severe    Past Surgical History  Procedure Laterality Date  . No surgical hx    . Peripheral vascular catheterization N/A 02/01/2015    Procedure: Abdominal Aortogram;  Surgeon: Iran OuchMuhammad A Arida, MD;  Location: [facility];  Service: Cardiovascular;  Laterality: N/A;    Current Medications: Outpatient Prescriptions Prior to Visit  Medication Sig Dispense Refill  . acetaminophen (TYLENOL) 325 MG tablet Take 650 mg by mouth every 6 (six) hours as needed (For pain).    Marland Kitchen. aspirin 81 MG tablet Take 81 mg by mouth daily.    . cilostazol (PLETAL) 50 MG tablet Take 1 tablet (50 mg total) by mouth 2 (two) times daily. 60 tablet 11  . Multiple Vitamin (MULTIVITAMIN) tablet Take 1 tablet by mouth daily.     No facility-administered medications  prior to visit.     Allergies:   Penicillins   Social History   Social History  . Marital Status: Married    Spouse Name: N/A  . Number of Children: N/A  . Years of Education: N/A   Social History Main Topics  . Smoking status: Never Smoker   . Smokeless tobacco: None  . Alcohol Use: Yes     Comment: rare  . Drug Use: No  . Sexual Activity: Not Asked   Other Topics Concern  . None   Social History Narrative     Family History:  The patient's family history includes CVA in his father; Coronary artery disease in his father and paternal uncle; Heart disease in his father and paternal uncle; Hypertension in his father.   ROS:   Please see the history of present illness.    ROS All other systems reviewed and are negative.   PHYSICAL EXAM:   VS:  BP 120/79 mmHg  Pulse 94  Ht 5\' 8"  (1.727 m)  Wt 214 lb (97.07 kg)  BMI 32.55 kg/m2   GEN: Well nourished, well developed, in no acute distress HEENT: normal Neck: no JVD, carotid bruits, or masses Cardiac: RRR; no murmurs, rubs, or gallops,no edema.  Intact distal pulses bilaterally.  Respiratory:  clear to auscultation bilaterally, normal work of breathing GI: soft, nontender, nondistended, + BS MS: no deformity or atrophy Skin: warm and dry, no rash Neuro:  Alert and Oriented x 3, Strength and sensation are intact Psych: euthymic mood, full affect  Wt Readings from Last 3 Encounters:  11/02/15 214 lb (97.07 kg)  02/21/15 208 lb (94.348 kg)  02/01/15 204 lb (92.534 kg)      Studies/Labs Reviewed:   EKG:  EKG is not ordered today.    Recent Labs: 01/27/2015: BUN 15; Creatinine, Ser 1.20; Hemoglobin 16.7; Platelets 206.0; Potassium 4.3; Sodium 138   Lipid Panel No results found for: CHOL, TRIG, HDL, CHOLHDL, VLDL, LDLCALC, LDLDIRECT  Additional studies/ records that were reviewed today include:  CPAP downlaod    ASSESSMENT:    1. OSA (obstructive sleep apnea)   2. VSD (ventricular septal defect)   3.  Obesity (BMI 30-39.9)      PLAN:  In order of problems listed above:  OSA - the patient is tolerating PAP therapy well without any problems. The PAP download was reviewed today and showed an AHI of 1.3/hr on 12/8 cm H2O with 99% compliance in using more than 4 hours nightly.  The patient has been using and benefiting from CPAP use and will continue to benefit from therapy.  VSD s/p repair - he has not had an echo in 7 years - will repeat. Obesity - I have encouraged him to get into a routine exercise program and cut back on carbs and portions.     Medication Adjustments/Labs and Tests Ordered: Current medicines are reviewed at length with the patient today.  Concerns regarding medicines are outlined above.  Medication changes, Labs and Tests ordered today are listed in the Patient Instructions below.  There are no Patient Instructions on file for this visit.   Signed, Armanda Magic, MD  11/02/2015 3:40 PM    [facility] [address] [place], [place], [place]  [address] Phone: [phone]; Fax: [phone]

## 2015-11-02 NOTE — Patient Instructions (Signed)

## 2015-11-20 ENCOUNTER — Ambulatory Visit (HOSPITAL_COMMUNITY): Payer: BC Managed Care – PPO | Attending: Cardiovascular Disease

## 2015-11-20 ENCOUNTER — Other Ambulatory Visit: Payer: Self-pay

## 2015-11-20 DIAGNOSIS — Z8249 Family history of ischemic heart disease and other diseases of the circulatory system: Secondary | ICD-10-CM | POA: Diagnosis not present

## 2015-11-20 DIAGNOSIS — Q210 Ventricular septal defect: Secondary | ICD-10-CM | POA: Diagnosis not present

## 2015-11-20 DIAGNOSIS — G4733 Obstructive sleep apnea (adult) (pediatric): Secondary | ICD-10-CM | POA: Diagnosis not present

## 2015-11-20 DIAGNOSIS — I517 Cardiomegaly: Secondary | ICD-10-CM | POA: Insufficient documentation

## 2015-11-20 LAB — ECHOCARDIOGRAM COMPLETE
Ao-asc: 27 cm
E decel time: 141 ms
E/e' ratio: 5.46
FS: 18 % — AB (ref 28–44)
IVS/LV PW RATIO, ED: 0.73
LA ID, A-P, ES: 36 mm
LA diam end sys: 36 mm
LA diam index: 1.71 cm/m2
LA vol A4C: 36.9 mL
LA vol index: 18.3 mL/m2
LA vol: 38.5 mL
LV E/e' medial: 5.46
LV E/e'average: 5.46
LV PW d: 13.3 mm — AB (ref 0.6–1.1)
LV e' LATERAL: 13.6 cm/s
LVOT SV: 80 mL
LVOT VTI: 17.6 cm
LVOT area: 4.52 cm2
LVOT diameter: 24 mm
LVOT peak vel: 99.8 cm/s
MV Dec: 141
MV Peak grad: 2 mmHg
MV pk A vel: 75.7 m/s
MV pk E vel: 74.2 m/s
TAPSE: 19.3 mm
TDI e' lateral: 13.6
TDI e' medial: 9.45

## 2016-03-26 ENCOUNTER — Encounter: Payer: Self-pay | Admitting: Cardiovascular Disease

## 2016-03-26 ENCOUNTER — Ambulatory Visit (INDEPENDENT_AMBULATORY_CARE_PROVIDER_SITE_OTHER): Payer: BC Managed Care – PPO | Admitting: Cardiovascular Disease

## 2016-03-26 VITALS — BP 120/78 | HR 81 | Ht 66.0 in | Wt 211.4 lb

## 2016-03-26 DIAGNOSIS — I739 Peripheral vascular disease, unspecified: Secondary | ICD-10-CM

## 2016-03-26 DIAGNOSIS — Z01810 Encounter for preprocedural cardiovascular examination: Secondary | ICD-10-CM | POA: Diagnosis not present

## 2016-03-26 NOTE — Progress Notes (Signed)
Cardiology Office Note   Date:  03/26/2016   ID:  Jackson JulyMichael A Artz, DOB Jun 01, 1970, MRN [id number]  PCP:  Darrow BussingKOIRALA,DIBAS, MD  Cardiologist:   Lorine BearsMuhammad Alishia Lebo, MD   Chief Complaint  Patient presents with  . intermittent claudication      History of Present Illness: Jackson Blackburn is a 45 y.o. male who presents for a follow-up visit regarding  claudication. He has known history of VSD repair in 1974. He was 45 years old at that time and required catheterization via the right femoral artery. He has a scar in that area. Other medical problems include obesity and obstructive sleep apnea.  He was seen for right calf claudication in 2016. He underwent noninvasive evaluation which showed normal ABI bilaterally. 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 Angiography in 01/2015 showed flush occlusion of the right SFA with reconstitution in the midsegment. Extensive collaterals from the profunda. There was overall no significant atherosclerosis noted.  He was treated medically with cilostazol. The SFA occlusion was felt to be traumatic from previous surgery and catheterization was 45 years old. He has been doing well overall with mild right calf claudication which has been stable. He denies any chest pain or significant dyspnea. He is scheduled to have an umbilical hernia surgery in December.  Past Medical History:  Diagnosis Date  . OSA (obstructive sleep apnea)    Severe  . VSD (ventricular septal defect)    Congenital    Past Surgical History:  Procedure Laterality Date  . no surgical hx    . PERIPHERAL VASCULAR CATHETERIZATION N/A 02/01/2015   Procedure: Abdominal Aortogram;  Surgeon: Iran OuchMuhammad A Louie Flenner, MD;  Location: [facility];  Service: Cardiovascular;  Laterality: N/A;     Current Outpatient Prescriptions  Medication Sig Dispense Refill  . acetaminophen (TYLENOL) 325 MG tablet Take 650 mg by mouth every 6 (six) hours as needed (For  pain).    Marland Kitchen. aspirin 81 MG tablet Take 81 mg by mouth daily.    . cilostazol (PLETAL) 50 MG tablet Take 1 tablet (50 mg total) by mouth 2 (two) times daily. 60 tablet 11  . Multiple Vitamin (MULTIVITAMIN) tablet Take 1 tablet by mouth daily.     No current facility-administered medications for this visit.     Allergies:   Penicillins    Social History:  The patient  reports that he has never smoked. He does not have any smokeless tobacco history on file. He reports that he drinks alcohol. He reports that he does not use drugs.   Family History:  The patient's family history includes CVA in his father; Coronary artery disease in his father and paternal uncle; Heart disease in his father and paternal uncle; Hypertension in his father.    ROS:  Please see the history of present illness.   Otherwise, review of systems are positive for none.   All other systems are reviewed and negative.    PHYSICAL EXAM: VS:  BP 120/78   Pulse 81   Ht 5\' 6"  (1.676 m)   Wt 211 lb 6.4 oz (95.9 kg)   BMI 34.12 kg/m  , BMI Body mass index is 34.12 kg/m. GEN: Well nourished, well developed, in no acute distress  HEENT: normal  Neck: no JVD, carotid bruits, or masses Cardiac: RRR; no murmurs, rubs, or gallops,no edema  Respiratory:  clear to auscultation bilaterally, normal work of breathing GI: soft, nontender, nondistended, + BS MS: no deformity  or atrophy  Skin: warm and dry, no rash Neuro:  Strength and sensation are intact Psych: euthymic mood, full affect   EKG:  EKG is ordered today. The ekg ordered today demonstrates normal sinus rhythm with incomplete right bundle-branch block. No significant change from before.   Recent Labs: No results found for requested labs within last 8760 hours.    Lipid Panel No results found for: CHOL, TRIG, HDL, CHOLHDL, VLDL, LDLCALC, LDLDIRECT    Wt Readings from Last 3 Encounters:  03/26/16 211 lb 6.4 oz (95.9 kg)  11/02/15 214 lb (97.1 kg)  02/21/15  208 lb (94.3 kg)       No flowsheet data found.    ASSESSMENT AND PLAN:  1.  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 Continue medical therapy.  2. Obstructive sleep apnea: On CPAP.   3. Preoperative cardiovascular evaluation for hernia surgery: The patient has no anginal symptoms and has good functional capacity. His EKG shows incomplete right bundle branch block which has been chronic and likely related to his previous surgery. He had an echocardiogram done in July which showed normal LV systolic function. He is considered at an overall low risk from a cardiac standpoint for further ischemic workup. Aspirin can be held one week before. Cilostazol can be held 3 days before surgery.   Disposition:   FU with me in 1 year  Signed,  Lorine BearsMuhammad Glenyce Randle, MD  03/26/2016 8:14 AM    [facility]

## 2016-03-26 NOTE — Patient Instructions (Signed)
Medication Instructions:  Your physician recommends that you continue on your current medications as directed. Please refer to the Current Medication list given to you today.  Labwork: No new orders.   Testing/Procedures: No new orders.   Follow-Up: Your physician wants you to follow-up in: 1 YEAR with Dr Kirke CorinArida.  You will receive a reminder letter in the mail two months in advance. If you don't receive a letter, please call our office to schedule the follow-up appointment.  Any Other Special Instructions Will Be Listed Below (If Applicable).  Patient is at low risk for umbilical hernia surgery, okay to proceed.  The patient should hold Aspirin 7 days prior to surgery and Pletal (Cilostazol) 3 days prior to surgery. Okay to resume when safe from surgical standpoint.    If you need a refill on your cardiac medications before your next appointment, please call your pharmacy.

## 2016-08-01 ENCOUNTER — Other Ambulatory Visit: Payer: Self-pay | Admitting: Cardiovascular Disease

## 2016-10-18 ENCOUNTER — Ambulatory Visit (INDEPENDENT_AMBULATORY_CARE_PROVIDER_SITE_OTHER): Payer: BC Managed Care – PPO | Admitting: Cardiology

## 2016-10-18 ENCOUNTER — Encounter: Payer: Self-pay | Admitting: Cardiology

## 2016-10-18 VITALS — BP 120/68 | HR 96 | Ht 66.0 in | Wt 213.8 lb

## 2016-10-18 DIAGNOSIS — R002 Palpitations: Secondary | ICD-10-CM | POA: Diagnosis not present

## 2016-10-18 DIAGNOSIS — Q210 Ventricular septal defect: Secondary | ICD-10-CM | POA: Diagnosis not present

## 2016-10-18 DIAGNOSIS — E669 Obesity, unspecified: Secondary | ICD-10-CM | POA: Diagnosis not present

## 2016-10-18 DIAGNOSIS — G4733 Obstructive sleep apnea (adult) (pediatric): Secondary | ICD-10-CM

## 2016-10-18 DIAGNOSIS — I739 Peripheral vascular disease, unspecified: Secondary | ICD-10-CM

## 2016-10-18 DIAGNOSIS — R079 Chest pain, unspecified: Secondary | ICD-10-CM | POA: Diagnosis not present

## 2016-10-18 NOTE — Addendum Note (Signed)
Addended by: Gunnar FusiKEMP, Micaiah Litle A on: 10/18/2016 03:55 PM   Modules accepted: Orders

## 2016-10-18 NOTE — Patient Instructions (Signed)
Medication Instructions:  Your physician recommends that you continue on your current medications as directed. Please refer to the Current Medication list given to you today.   Labwork: None  Testing/Procedures: Your physician has requested that you have an exercise tolerance test. For further information please visit [email or web address]. Please also follow instruction sheet, as given.  Your physician has recommended that you wear an event monitor. Event monitors are medical devices that record the heart's electrical activity. Doctors most often us these monitors to diagnose arrhythmias. Arrhythmias are problems with the speed or rhythm of the heartbeat. The monitor is a small, portable device. You can wear one while you do your normal daily activities. This is usually used to diagnose what is causing palpitations/syncope (passing out).  Follow-Up: Your physician wants you to follow-up in: 1 year with Dr. Mayford Knifeurner. You will receive a reminder letter in the mail two months in advance. If you don't receive a letter, please call our office to schedule the follow-up appointment.   Any Other Special Instructions Will Be Listed Below (If Applicable). An order has been sent to [facility] to change your ramp time.    If you need a refill on your cardiac medications before your next appointment, please call your pharmacy.

## 2016-10-18 NOTE — Progress Notes (Signed)
Cardiology Office Note    Date:  10/18/2016   ID:  Jackson Blackburn, DOB Dec 09, 1970, MRN [id number]  PCP:  Darrow Bussing, MD  Cardiologist:  Armanda Magic, MD   Chief Complaint  Patient presents with  . Sleep Apnea    History of Present Illness:  Jackson Blackburn is a 46 y.o. male with a history of OSA .  He is here today for followup and is doing well. He is doing well with his BiPAP. He tolerates the nasal pillow mask with chin strap and feels the pressure is adequate.He feels rested in the am if he has gotten a good nights rest.  He does not nap during the day.  He occasionally has a dry mouth and will take a drink of water at night.   He has a history of VSD s/p repair as a child and had an echo several years ago that showed no residual.He denies SOB, DOE (except with running marathons) dizziness or syncope. He has also noticed some palpitations in which he noticed his heart rate increasing on his fit bit up to 130bpm.  He thinks it is due to stress at work and home.  He complaints of pain in the back of his legs when running.  Sometimes his legs feel tight. He also has had some problems with chest discomfort which is dull and is nonexertional and usually in stressful situations at work.     Past Medical History:  Diagnosis Date  . OSA (obstructive sleep apnea)    Severe  . VSD (ventricular septal defect)    Congenital    Past Surgical History:  Procedure Laterality Date  . no surgical hx    . PERIPHERAL VASCULAR CATHETERIZATION N/A 02/01/2015   Procedure: Abdominal Aortogram;  Surgeon: Iran Ouch, MD;  [facility]: [facility];  Service: Cardiovascular;  Laterality: N/A;    Current Medications: Current Meds  Medication Sig  . acetaminophen (TYLENOL) 325 MG tablet Take 650 mg by mouth every 6 (six) hours as needed (For pain).  Marland Kitchen aspirin 81 MG tablet Take 81 mg by mouth daily.  . cilostazol (PLETAL) 50 MG tablet TAKE 1 TABLET BY MOUTH 2 TIMES DAILY  .  Multiple Vitamin (MULTIVITAMIN) tablet Take 1 tablet by mouth daily.    Allergies:   Penicillins   Social History   Social History  . Marital status: Married    Spouse name: N/A  . Number of children: N/A  . Years of education: N/A   Social History Main Topics  . Smoking status: Never Smoker  . Smokeless tobacco: Never Used  . Alcohol use Yes     Comment: rare  . Drug use: No  . Sexual activity: Not Asked   Other Topics Concern  . None   Social History Narrative  . None     Family History:  The patient's family history includes CVA in his father; Coronary artery disease in his father and paternal uncle; Heart disease in his father and paternal uncle; Hypertension in his father.   ROS:   Please see the history of present illness.    ROS All other systems reviewed and are negative.  No flowsheet data found.     PHYSICAL EXAM:   VS:  BP 120/68   Pulse 96   Ht 5\' 6"  (1.676 m)   Wt 213 lb 12.8 oz (97 kg)   SpO2 93%   BMI 34.51 kg/m    GEN: Well nourished, well developed,  in no acute distress  HEENT: normal  Neck: no JVD, carotid bruits, or masses Cardiac: RRR; no murmurs, rubs, or gallops,no edema.  Intact distal pulses bilaterally.  Respiratory:  clear to auscultation bilaterally, normal work of breathing GI: soft, nontender, nondistended, + BS MS: no deformity or atrophy  Skin: warm and dry, no rash Neuro:  Alert and Oriented x 3, Strength and sensation are intact Psych: euthymic mood, full affect  Wt Readings from Last 3 Encounters:  10/18/16 213 lb 12.8 oz (97 kg)  03/26/16 211 lb 6.4 oz (95.9 kg)  11/02/15 214 lb (97.1 kg)      Studies/Labs Reviewed:   EKG:  EKG is not ordered today.   Recent Labs: No results found for requested labs within last 8760 hours.   Lipid Panel No results found for: CHOL, TRIG, HDL, CHOLHDL, VLDL, LDLCALC, LDLDIRECT  Additional studies/ records that were reviewed today include:  CPAP download    ASSESSMENT:     1. OSA (obstructive sleep apnea)   2. VSD (ventricular septal defect)   3. Obesity (BMI 30-39.9)   4. Chest pain, unspecified type   5. Intermittent claudication (HCC)   6. Heart palpitations      PLAN:  In order of problems listed above:  OSA - the patient is tolerating PAP therapy well without any problems. The PAP download was reviewed today and showed an AHI of 1.2/hr on 12/8 cm H2O with 100% compliance in using more than 4 hours nightly.  The patient has been using and benefiting from CPAP use and will continue to benefit from therapy.  VSD s/p repair - he is asymptomatic. 2D echo 11/2015 showed low normal LVF with EF 50-55% with moderately dilated RA.  Continue to follow. Obesity - I have encouraged him to get into a routine exercise program and cut back on carbs and portions.  Atypical chest pain that sounds more stress related.  I will get an ETT to rule out ischemia. Intermittent claudication - he is followed by Dr. Kirke CorinArida Palpitations - I will get an event monitor to assess for arrhythmias but likely this is stress related.   Medication Adjustments/Labs and Tests Ordered: Current medicines are reviewed at length with the patient today.  Concerns regarding medicines are outlined above.  Medication changes, Labs and Tests ordered today are listed in the Patient Instructions below.  There are no Patient Instructions on file for this visit.   Signed, Armanda Magicraci Athina Fahey, MD  10/18/2016 3:35 PM    [facility] [address], [place], [place]  [address] Phone: [phone]; Fax: [phone]

## 2016-10-23 ENCOUNTER — Telehealth: Payer: Self-pay | Admitting: Cardiology

## 2016-10-23 NOTE — Telephone Encounter (Signed)
Patient wanted to know if monitor would be able to be worn in water. Informed patient the waterproof ability of the monitor depends on which one he is given. Reiterated to the patient he will be given instructions pertaining to his monitor at his appointment tomorrow. He was grateful for call and agrees with treatment plan.

## 2016-10-23 NOTE — Telephone Encounter (Signed)
New message    Pt is calling asking for RN to call him. He has a question about heart monitor before having it tomorrow.

## 2016-10-24 ENCOUNTER — Ambulatory Visit (INDEPENDENT_AMBULATORY_CARE_PROVIDER_SITE_OTHER): Payer: BC Managed Care – PPO

## 2016-10-24 ENCOUNTER — Telehealth: Payer: Self-pay | Admitting: *Deleted

## 2016-10-24 DIAGNOSIS — R002 Palpitations: Secondary | ICD-10-CM | POA: Diagnosis not present

## 2016-10-24 DIAGNOSIS — R079 Chest pain, unspecified: Secondary | ICD-10-CM | POA: Diagnosis not present

## 2016-10-24 LAB — EXERCISE TOLERANCE TEST
CHL CUP RESTING HR STRESS: 78 {beats}/min
CHL RATE OF PERCEIVED EXERTION: 16
CSEPEDS: 0 s
CSEPEW: 11.7 METS
Exercise duration (min): 10 min
MPHR: 172 {beats}/min
Peak HR: 155 {beats}/min
Percent HR: 90 %

## 2016-10-24 NOTE — Telephone Encounter (Signed)
-----   Message from Henrietta DineKathryn A Kemp, RN sent at 10/18/2016  5:04 PM EDT ----- Regarding: dme order Order is in to decrease ramp  thanks

## 2016-10-24 NOTE — Telephone Encounter (Signed)
[facility] ([facility]) did not have this order for the patient. Order has been faxed to [facility] [phone].   

## 2016-11-29 ENCOUNTER — Emergency Department (HOSPITAL_BASED_OUTPATIENT_CLINIC_OR_DEPARTMENT_OTHER): Payer: BC Managed Care – PPO

## 2016-11-29 ENCOUNTER — Emergency Department (HOSPITAL_BASED_OUTPATIENT_CLINIC_OR_DEPARTMENT_OTHER)
Admission: EM | Admit: 2016-11-29 | Discharge: 2016-11-29 | Disposition: A | Discharge status: Home / Self Care | Payer: BC Managed Care – PPO | Attending: Physician Assistant | Admitting: Physician Assistant

## 2016-11-29 ENCOUNTER — Encounter (HOSPITAL_BASED_OUTPATIENT_CLINIC_OR_DEPARTMENT_OTHER): Payer: Self-pay | Admitting: *Deleted

## 2016-11-29 DIAGNOSIS — R1084 Generalized abdominal pain: Secondary | ICD-10-CM | POA: Diagnosis present

## 2016-11-29 DIAGNOSIS — N23 Unspecified renal colic: Secondary | ICD-10-CM | POA: Insufficient documentation

## 2016-11-29 DIAGNOSIS — Z79899 Other long term (current) drug therapy: Secondary | ICD-10-CM | POA: Diagnosis not present

## 2016-11-29 DIAGNOSIS — N201 Calculus of ureter: Secondary | ICD-10-CM | POA: Diagnosis not present

## 2016-11-29 DIAGNOSIS — Z7982 Long term (current) use of aspirin: Secondary | ICD-10-CM | POA: Diagnosis not present

## 2016-11-29 LAB — CBC WITH DIFFERENTIAL/PLATELET
BASOS ABS: 0 10*3/uL (ref 0.0–0.1)
BASOS PCT: 0 %
EOS ABS: 0 10*3/uL (ref 0.0–0.7)
Eosinophils Relative: 0 %
HCT: 48 % (ref 39.0–52.0)
HEMOGLOBIN: 17.2 g/dL — AB (ref 13.0–17.0)
Lymphocytes Relative: 7 %
Lymphs Abs: 1 10*3/uL (ref 0.7–4.0)
MCH: 30.9 pg (ref 26.0–34.0)
MCHC: 35.8 g/dL (ref 30.0–36.0)
MCV: 86.2 fL (ref 78.0–100.0)
Monocytes Absolute: 0.7 10*3/uL (ref 0.1–1.0)
Monocytes Relative: 5 %
NEUTROS PCT: 88 %
Neutro Abs: 12.5 10*3/uL — ABNORMAL HIGH (ref 1.7–7.7)
Platelets: 216 10*3/uL (ref 150–400)
RBC: 5.57 MIL/uL (ref 4.22–5.81)
RDW: 13.1 % (ref 11.5–15.5)
WBC: 14.2 10*3/uL — AB (ref 4.0–10.5)

## 2016-11-29 LAB — COMPREHENSIVE METABOLIC PANEL
ALK PHOS: 58 U/L (ref 38–126)
ALT: 22 U/L (ref 17–63)
ANION GAP: 10 (ref 5–15)
AST: 21 U/L (ref 15–41)
Albumin: 4.4 g/dL (ref 3.5–5.0)
BILIRUBIN TOTAL: 1 mg/dL (ref 0.3–1.2)
BUN: 20 mg/dL (ref 6–20)
CALCIUM: 10.3 mg/dL (ref 8.9–10.3)
CO2: 24 mmol/L (ref 22–32)
CREATININE: 1.59 mg/dL — AB (ref 0.61–1.24)
Chloride: 105 mmol/L (ref 101–111)
GFR, EST AFRICAN AMERICAN: 58 mL/min — AB (ref >60)
GFR, EST NON AFRICAN AMERICAN: 50 mL/min — AB (ref >60)
Glucose, Bld: 140 mg/dL — ABNORMAL HIGH (ref 65–99)
Potassium: 4 mmol/L (ref 3.5–5.1)
Sodium: 139 mmol/L (ref 135–145)
TOTAL PROTEIN: 7.2 g/dL (ref 6.5–8.1)

## 2016-11-29 LAB — URINALYSIS, MICROSCOPIC (REFLEX): Squamous Epithelial / LPF: NONE SEEN

## 2016-11-29 LAB — URINALYSIS, ROUTINE W REFLEX MICROSCOPIC
BILIRUBIN URINE: NEGATIVE
Glucose, UA: NEGATIVE mg/dL
KETONES UR: 15 mg/dL — AB
LEUKOCYTES UA: NEGATIVE
NITRITE: NEGATIVE
Protein, ur: NEGATIVE mg/dL
SPECIFIC GRAVITY, URINE: 1.02 (ref 1.005–1.030)
pH: 6 (ref 5.0–8.0)

## 2016-11-29 MED ORDER — TAMSULOSIN HCL 0.4 MG PO CAPS: 0.4000 mg | ORAL_CAPSULE | Freq: Two times a day (BID) | ORAL | 0 refills | Status: DC

## 2016-11-29 MED ORDER — KETOROLAC TROMETHAMINE 10 MG PO TABS: 10.0000 mg | ORAL_TABLET | Freq: Four times a day (QID) | ORAL | 0 refills | Status: DC | PRN

## 2016-11-29 MED ORDER — ONDANSETRON HCL 4 MG PO TABS: 4.0000 mg | ORAL_TABLET | Freq: Three times a day (TID) | ORAL | 0 refills | Status: DC | PRN

## 2016-11-29 MED ORDER — OXYCODONE-ACETAMINOPHEN 5-325 MG PO TABS: 1.0000 | ORAL_TABLET | ORAL | 0 refills | Status: DC | PRN

## 2016-11-29 NOTE — Discharge Instructions (Signed)
Contact a health care provider if: You have a fever or chills. Your urine smells bad or looks cloudy. You have pain or burning when you pass urine. Get help right away if: Your flank pain or groin pain suddenly worsens. You become confused or disoriented or you lose consciousness. 

## 2016-11-29 NOTE — ED Provider Notes (Signed)
[facility]PT Provider Note   CSN: [id number] Arrival date & time: 11/29/16  1636  By signing my name below, I, Rosana Fretana Waskiewicz, attest that this documentation has been prepared under the direction and in the presence of Arthor CaptainAbigail Sade Mehlhoff, PA-C.  Electronically Signed: Rosana Fretana Waskiewicz, ED Scribe. 11/29/16. 5:50 PM.  History   Chief Complaint Chief Complaint  Patient presents with  . Abdominal Pain   The history is provided by the patient. No language interpreter was used.   HPI Comments: Jackson Blackburn is a 46 y.o. male with a PMHx of hernia repair (Dec 2017), who presents to the Emergency Department complaining of intermittent, worsening RLQ abdominal pain onset 4 days ago. Pt has two episodes of the same, 4 days ago and today. Pt describes pain as radiating into his right flank and groin. Pt reports associated vomiting today.  Pt was seen earlier today at [facility] and referred to the ED. He was given a shot of Toradol and states his pain has subsided. No hx of kidney stones or appendectomy. No other complaints at this time.  Past Medical History:  Diagnosis Date  . OSA (obstructive sleep apnea)    Severe  . VSD (ventricular septal defect)    Congenital    Patient Active Problem List   Diagnosis Date Noted  . Heart palpitations 10/18/2016  . Intermittent claudication (HCC) 12/13/2014  . Leg pain 10/21/2014  . Chest pain 03/29/2014  . Obesity (BMI 30-39.9) 08/31/2013  . VSD (ventricular septal defect) 08/31/2013  . S/P VSD repair 08/31/2013  . OSA (obstructive sleep apnea) 08/27/2013    Past Surgical History:  Procedure Laterality Date  . no surgical hx    . PERIPHERAL VASCULAR CATHETERIZATION N/A 02/01/2015   Procedure: Abdominal Aortogram;  Surgeon: Iran OuchMuhammad A Arida, MD;  Location: [facility];  Service: Cardiovascular;  Laterality: N/A;       Home Medications    Prior to Admission medications   Medication Sig Start Date End Date Taking? Authorizing  Provider  acetaminophen (TYLENOL) 325 MG tablet Take 650 mg by mouth every 6 (six) hours as needed (For pain).   Yes [provider]  aspirin 81 MG tablet Take 81 mg by mouth daily.   Yes [provider]  cilostazol (PLETAL) 50 MG tablet TAKE 1 TABLET BY MOUTH 2 TIMES DAILY 08/01/16  Yes Iran OuchArida, Muhammad A, MD  Multiple Vitamin (MULTIVITAMIN) tablet Take 1 tablet by mouth daily.   Yes [provider]  ketorolac (TORADOL) 10 MG tablet Take 1 tablet (10 mg total) by mouth every 6 (six) hours as needed. 11/29/16   Kegan Mckeithan, Cammy CopaAbigail, PA-C  ondansetron (ZOFRAN) 4 MG tablet Take 1 tablet (4 mg total) by mouth every 8 (eight) hours as needed for nausea or vomiting. 11/29/16   Arthor CaptainHarris, Rodel Glaspy, PA-C  oxyCODONE-acetaminophen (PERCOCET) 5-325 MG tablet Take 1-2 tablets by mouth every 4 (four) hours as needed. 11/29/16   Arthor CaptainHarris, Gurkaran Rahm, PA-C  tamsulosin (FLOMAX) 0.4 MG CAPS capsule Take 1 capsule (0.4 mg total) by mouth 2 (two) times daily. 11/29/16   Arthor CaptainHarris, Bharat Antillon, PA-C    Family History Family History  Problem Relation Age of Onset  . Hypertension Father   . CVA Father   . Heart disease Father   . Coronary artery disease Father   . Heart disease Paternal Uncle   . Coronary artery disease Paternal Uncle     Social History Social History  Substance Use Topics  . Smoking status: Never Smoker  .  Smokeless tobacco: Never Used  . Alcohol use Yes     Comment: rare     Allergies   Penicillins   Review of Systems Review of Systems All other systems reviewed and are negative for acute change except as noted in the HPI.  Physical Exam Updated Vital Signs BP 124/72   Pulse 80   Temp 98.2 F (36.8 C) (Oral)   Resp 20   Ht 5\' 8"  (1.727 m)   Wt 95.3 kg (210 lb)   SpO2 96%   BMI 31.93 kg/m   Physical Exam  Constitutional: He is oriented to person, place, and time. He appears well-developed and well-nourished.  Appears comfortable.   HENT:  Head: Normocephalic and  atraumatic.  Eyes: EOM are normal.  Neck: Normal range of motion.  Cardiovascular: Normal rate, regular rhythm, normal heart sounds and intact distal pulses.   Pulmonary/Chest: Effort normal and breath sounds normal. No respiratory distress.  Abdominal: Soft. Bowel sounds are normal. He exhibits no distension. There is no tenderness.  No abdominal pain. No CVA tenderness.   Musculoskeletal: Normal range of motion.  Neurological: He is alert and oriented to person, place, and time.  Skin: Skin is warm and dry.  Psychiatric: He has a normal mood and affect. Judgment normal.  Nursing note and vitals reviewed.    ED Treatments / Results  DIAGNOSTIC STUDIES: Oxygen Saturation is 98% on RA, normal by my interpretation.   COORDINATION OF CARE: 5:43 PM-Discussed next steps with pt including a renal stone study. Pt verbalized understanding and is agreeable with the plan.   Labs (all labs ordered are listed, but only abnormal results are displayed) Labs Reviewed  COMPREHENSIVE METABOLIC PANEL - Abnormal; Notable for the following:       Result Value   Glucose, Bld 140 (*)    Creatinine, Ser 1.59 (*)    GFR calc non Af Amer 50 (*)    GFR calc Af Amer 58 (*)    All other components within normal limits  CBC WITH DIFFERENTIAL/PLATELET - Abnormal; Notable for the following:    WBC 14.2 (*)    Hemoglobin 17.2 (*)    Neutro Abs 12.5 (*)    All other components within normal limits  URINALYSIS, ROUTINE W REFLEX MICROSCOPIC - Abnormal; Notable for the following:    APPearance CLOUDY (*)    Hgb urine dipstick MODERATE (*)    Ketones, ur 15 (*)    All other components within normal limits  URINALYSIS, MICROSCOPIC (REFLEX) - Abnormal; Notable for the following:    Bacteria, UA RARE (*)    All other components within normal limits    EKG  EKG Interpretation None       Radiology No results found.  Procedures Procedures (including critical care time)  Medications Ordered in  ED Medications - No data to display   Initial Impression / Assessment and Plan / ED Course  I have reviewed the triage vital signs and the nursing notes.  Pertinent labs & imaging results that were available during my care of the patient were reviewed by me and considered in my medical decision making (see chart for details).       Pt has been diagnosed with a Kidney Stone via CT. There is no evidence of significant hydronephrosis, serum creatine WNL, vitals sign stable and the pt does not have irratractable vomiting. Pt will be dc home with pain medications & has been advised to follow up with PCP.   Final Clinical  Impressions(s) / ED Diagnoses   Final diagnoses:  Ureterolithiasis  Renal colic on right side    New Prescriptions Discharge Medication List as of 11/29/2016  7:45 PM    START taking these medications   Details  ketorolac (TORADOL) 10 MG tablet Take 1 tablet (10 mg total) by mouth every 6 (six) hours as needed., Starting Fri 11/29/2016, Print    ondansetron (ZOFRAN) 4 MG tablet Take 1 tablet (4 mg total) by mouth every 8 (eight) hours as needed for nausea or vomiting., Starting Fri 11/29/2016, Print    oxyCODONE-acetaminophen (PERCOCET) 5-325 MG tablet Take 1-2 tablets by mouth every 4 (four) hours as needed., Starting Fri 11/29/2016, Print    tamsulosin (FLOMAX) 0.4 MG CAPS capsule Take 1 capsule (0.4 mg total) by mouth 2 (two) times daily., Starting Fri 11/29/2016, Print       I personally performed the services described in this documentation, which was scribed in my presence. The recorded information has been reviewed and is accurate.       Arthor Captain, PA-C 12/04/16 1546    Abelino Derrick, MD 12/05/16 [phone]

## 2016-11-29 NOTE — ED Notes (Signed)
Pt. Reports he did vomit today

## 2016-11-29 NOTE — ED Triage Notes (Signed)
Abdominal pain. Right lower quadrant pain this week that feels like gas. He was seen at [facility] and told to come here for further follow up.

## 2017-04-01 ENCOUNTER — Ambulatory Visit: Payer: BC Managed Care – PPO | Admitting: Cardiovascular Disease

## 2017-04-15 ENCOUNTER — Ambulatory Visit: Payer: BC Managed Care – PPO | Admitting: Cardiovascular Disease

## 2017-04-15 ENCOUNTER — Encounter: Payer: Self-pay | Admitting: Cardiovascular Disease

## 2017-04-15 VITALS — BP 100/62 | HR 74 | Ht 68.0 in | Wt 218.2 lb

## 2017-04-15 DIAGNOSIS — I739 Peripheral vascular disease, unspecified: Secondary | ICD-10-CM | POA: Diagnosis not present

## 2017-04-15 NOTE — Patient Instructions (Signed)
Medication Instructions: Your physician recommends that you continue on your current medications as directed. Please refer to the Current Medication list given to you today.  If you need a refill on your cardiac medications before your next appointment, please call your pharmacy.   Labwork: none  Procedures/Testing: none  Follow-Up: Your physician wants you to follow-up in: 12 months with Dr. Kirke CorinArida. You will receive a reminder letter in the mail two months in advance. If you don't receive a letter, please call our office at [phone] to schedule this follow-up appointment.   Thank you for choosing [facility] at [facility]!!

## 2017-04-15 NOTE — Progress Notes (Signed)
Cardiology Office Note   Date:  04/15/2017   ID:  Jackson JulyMichael A Iwata, DOB [birth year], MRN [id number]  PCP:  Darrow BussingKoirala, Dibas, MD  Cardiologist:   Lorine BearsMuhammad Arida, MD   No chief complaint on file.     History of Present Illness: Jackson Blackburn is a 46 y.o. male who presents for a follow-up visit regarding  claudication. He has known history of VSD repair in 1974. He was 46 years old at that time and required catheterization via the right femoral artery. He has a scar in that area. Other medical problems include obesity and obstructive sleep apnea.  He was seen for right calf claudication in 2016. He underwent noninvasive evaluation which showed normal ABI bilaterally. 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 Angiography in 01/2015 showed flush occlusion of the right SFA with reconstitution in the midsegment. Extensive collaterals from the profunda. There was overall no significant atherosclerosis noted.  The SFA occlusion was felt to be traumatic from previous surgery and catheterization was 46 years old.   He underwent umbilical hernia surgery last year without complications.  He has been doing very well and denies any chest pain or shortness of breath.  He has minimal right calf claudication.  He continues to be able to walk but with some running for exercise.  Past Medical History:  Diagnosis Date  . OSA (obstructive sleep apnea)    Severe  . VSD (ventricular septal defect)    Congenital    Past Surgical History:  Procedure Laterality Date  . no surgical hx    . PERIPHERAL VASCULAR CATHETERIZATION N/A 02/01/2015   Procedure: Abdominal Aortogram;  Surgeon: Iran OuchMuhammad A Arida, MD;  Location: [facility] INV[facility] CV [facility];  Service: Cardiovascular;  Laterality: N/A;     Current Outpatient Medications  Medication Sig Dispense Refill  . acetaminophen (TYLENOL) 325 MG tablet Take 650 mg by mouth every 6 (six) hours as needed (For pain).    Marland Kitchen. aspirin 81 MG  tablet Take 81 mg by mouth daily.    . cilostazol (PLETAL) 50 MG tablet TAKE 1 TABLET BY MOUTH 2 TIMES DAILY 60 tablet 6  . Multiple Vitamin (MULTIVITAMIN) tablet Take 1 tablet by mouth daily.    . tamsulosin (FLOMAX) 0.4 MG CAPS capsule Take 1 capsule (0.4 mg total) by mouth 2 (two) times daily. 10 capsule 0   No current facility-administered medications for this visit.     Allergies:   Penicillins    Social History:  The patient  reports that  has never smoked. he has never used smokeless tobacco. He reports that he drinks alcohol. He reports that he does not use drugs.   Family History:  The patient's family history includes CVA in his father; Coronary artery disease in his father and paternal uncle; Heart disease in his father and paternal uncle; Hypertension in his father.    ROS:  Please see the history of present illness.   Otherwise, review of systems are positive for none.   All other systems are reviewed and negative.    PHYSICAL EXAM: VS:  BP 100/62   Pulse 74   Ht 5\' 8"  (1.727 m)   Wt 218 lb 3.2 oz (99 kg)   SpO2 94%   BMI 33.18 kg/m  , BMI Body mass index is 33.18 kg/m. GEN: Well nourished, well developed, in no acute distress  HEENT: normal  Neck: no JVD, carotid bruits, or masses Cardiac: RRR; no murmurs, rubs, or  gallops,no edema  Respiratory:  clear to auscultation bilaterally, normal work of breathing GI: soft, nontender, nondistended, + BS MS: no deformity or atrophy  Skin: warm and dry, no rash Neuro:  Strength and sensation are intact Psych: euthymic mood, full affect   EKG:  EKG is ordered today. The ekg ordered today demonstrates normal sinus rhythm with incomplete right bundle-branch block. No significant change from before.   Recent Labs: 11/29/2016: ALT 22; BUN 20; Creatinine, Ser 1.59; Hemoglobin 17.2; Platelets 216; Potassium 4.0; Sodium 139    Lipid Panel No results found for: CHOL, TRIG, HDL, CHOLHDL, VLDL, LDLCALC, LDLDIRECT    Wt  Readings from Last 3 Encounters:  04/15/17 218 lb 3.2 oz (99 kg)  11/29/16 210 lb (95.3 kg)  10/18/16 213 lb 12.8 oz (97 kg)       No flowsheet data found.    ASSESSMENT AND PLAN:  1.  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  He has no evidence of atherosclerosis.  Continue small dose cilostazol. Symptoms are overall mild and not lifestyle limiting. Continue medical therapy.  2. Obstructive sleep apnea: On CPAP.    Disposition:   FU with me in 1 year  Signed,  Lorine BearsMuhammad Arida, MD  04/15/2017 10:06 AM    [facility]

## 2017-05-23 ENCOUNTER — Ambulatory Visit
Admission: RE | Admit: 2017-05-23 | Discharge: 2017-05-23 | Disposition: A | Discharge status: Home / Self Care | Payer: BC Managed Care – PPO | Source: Ambulatory Visit | Attending: Family Medicine | Admitting: Family Medicine

## 2017-05-23 ENCOUNTER — Other Ambulatory Visit: Payer: Self-pay | Admitting: Family Medicine

## 2017-05-23 DIAGNOSIS — G44319 Acute post-traumatic headache, not intractable: Secondary | ICD-10-CM

## 2017-08-18 ENCOUNTER — Other Ambulatory Visit: Payer: Self-pay | Admitting: *Deleted

## 2017-08-18 MED ORDER — CILOSTAZOL 50 MG PO TABS: 50.0000 mg | ORAL_TABLET | Freq: Two times a day (BID) | ORAL | 11 refills | Status: DC

## 2017-12-01 ENCOUNTER — Ambulatory Visit: Payer: BC Managed Care – PPO | Admitting: Cardiology

## 2017-12-01 ENCOUNTER — Encounter: Payer: Self-pay | Admitting: Cardiology

## 2017-12-01 VITALS — BP 132/78 | HR 84 | Ht 68.0 in | Wt 217.0 lb

## 2017-12-01 DIAGNOSIS — G4733 Obstructive sleep apnea (adult) (pediatric): Secondary | ICD-10-CM

## 2017-12-01 DIAGNOSIS — E669 Obesity, unspecified: Secondary | ICD-10-CM | POA: Diagnosis not present

## 2017-12-01 NOTE — Patient Instructions (Addendum)
Medication Instructions:   Your physician recommends that you continue on your current medications as directed. Please refer to the Current Medication list given to you today.   If you need a refill on your cardiac medications before your next appointment, please call your pharmacy.  Labwork: NONE ORDERED  TODAY    Testing/Procedures: NONE ORDERED  TODAY    Follow-Up: Your physician wants you to follow-up in: ONE YEAR WITH  DR TURNER  You will receive a reminder letter in the mail two months in advance. If you don't receive a letter, please call our office to schedule the follow-up appointment.    Any Other Special Instructions Will Be Listed Below (If Applicable).                                                                                                                                                   

## 2017-12-01 NOTE — Progress Notes (Signed)
Cardiology Office Note:    Date:  12/01/2017   ID:  Jackson Blackburn, DOB June 05, 1970, MRN [id number]  PCP:  Darrow BussingKoirala, Dibas, MD  Cardiologist:  No primary care provider on file.    Referring MD: Darrow BussingKoirala, Dibas, MD   Chief Complaint  Patient presents with  . Follow-up    OSA    History of Present Illness:    Jackson Blackburn is a 47 y.o. male with a hx of OSA on BIPAP therapy and VSD s/p repair as a child.  He is here today for followup and is doing well.  He denies any chest pain or pressure, SOB, DOE, PND, orthopnea, LE edema, dizziness, palpitations or syncope. He is compliant with his meds and is tolerating meds with no SE.  He is doing well with his CPAP device.  He tolerates the mask and feels the pressure is adequate.  Since going on CPAP he feels rested in the am and has no significant daytime sleepiness.  He denies any significant mouth or nasal dryness or nasal congestion.  He does not think that he snores.     Past Medical History:  Diagnosis Date  . OSA (obstructive sleep apnea)    Severe  . VSD (ventricular septal defect)    Congenital    Past Surgical History:  Procedure Laterality Date  . no surgical hx    . PERIPHERAL VASCULAR CATHETERIZATION N/A 02/01/2015   Procedure: Abdominal Aortogram;  Surgeon: Iran OuchMuhammad A Arida, MD;  Location: [facility];  Service: Cardiovascular;  Laterality: N/A;    Current Medications: Current Meds  Medication Sig  . acetaminophen (TYLENOL) 325 MG tablet Take 650 mg by mouth every 6 (six) hours as needed (For pain).  Marland Kitchen. aspirin 81 MG tablet Take 81 mg by mouth daily.  . cilostazol (PLETAL) 50 MG tablet Take 1 tablet (50 mg total) by mouth 2 (two) times daily.  . Multiple Vitamin (MULTIVITAMIN) tablet Take 1 tablet by mouth daily.  . tamsulosin (FLOMAX) 0.4 MG CAPS capsule Take 1 capsule (0.4 mg total) by mouth 2 (two) times daily.     Allergies:   Penicillins   Social History   Socioeconomic History  . Marital status:  Married    Spouse name: Not on file  . Number of children: Not on file  . Years of education: Not on file  . Highest education level: Not on file  Occupational History  . Not on file  Social Needs  . Financial resource strain: Not on file  . Food insecurity:    Worry: Not on file    Inability: Not on file  . Transportation needs:    Medical: Not on file    Non-medical: Not on file  Tobacco Use  . Smoking status: Never Smoker  . Smokeless tobacco: Never Used  Substance and Sexual Activity  . Alcohol use: Yes    Comment: rare  . Drug use: No  . Sexual activity: Not on file  Lifestyle  . Physical activity:    Days per week: Not on file    Minutes per session: Not on file  . Stress: Not on file  Relationships  . Social connections:    Talks on phone: Not on file    Gets together: Not on file    Attends religious service: Not on file    Active member of club or organization: Not on file    Attends meetings of clubs or organizations: Not on file  Relationship status: Not on file  Other Topics Concern  . Not on file  Social History Narrative  . Not on file     Family History: The patient's family history includes CVA in his father; Coronary artery disease in his father and paternal uncle; Heart disease in his father and paternal uncle; Hypertension in his father.  ROS:   Please see the history of present illness.    ROS  All other systems reviewed and negative.   EKGs/Labs/Other Studies Reviewed:    The following studies were reviewed today: PAP download  EKG:  EKG is not ordered today.    Recent Labs: No results found for requested labs within last 8760 hours.   Recent Lipid Panel No results found for: CHOL, TRIG, HDL, CHOLHDL, VLDL, LDLCALC, LDLDIRECT  Physical Exam:    VS:  BP 132/78 (BP Location: Right Arm, Patient Position: Sitting, Cuff Size: Normal)   Pulse 84   Ht 5\' 8"  (1.727 m)   Wt 217 lb (98.4 kg)   SpO2 96%   BMI 32.99 kg/m     Wt  Readings from Last 3 Encounters:  12/01/17 217 lb (98.4 kg)  04/15/17 218 lb 3.2 oz (99 kg)  11/29/16 210 lb (95.3 kg)     GEN:  Well nourished, well developed in no acute distress HEENT: Normal NECK: No JVD; No carotid bruits LYMPHATICS: No lymphadenopathy CARDIAC: RRR, no murmurs, rubs, gallops RESPIRATORY:  Clear to auscultation without rales, wheezing or rhonchi  ABDOMEN: Soft, non-tender, non-distended MUSCULOSKELETAL:  No edema; No deformity  SKIN: Warm and dry NEUROLOGIC:  Alert and oriented x 3 PSYCHIATRIC:  Normal affect   ASSESSMENT:    1. OSA (obstructive sleep apnea)   2. Obesity (BMI 30-39.9)    PLAN:    In order of problems listed above:  1.  OSA - the patient is tolerating PAP therapy well without any problems. The PAP download was reviewed today and showed an AHI of 1.1/hr on 12/8 cm H2O with 100% compliance in using more than 4 hours nightly.  The patient has been using and benefiting from PAP use and will continue to benefit from therapy.     2.  Obesity - I have encouraged him  to get into a routine exercise program and cut back on carbs and portions.   Medication Adjustments/Labs and Tests Ordered: Current medicines are reviewed at length with the patient today.  Concerns regarding medicines are outlined above.  No orders of the defined types were placed in this encounter.  No orders of the defined types were placed in this encounter.   Signed, Armanda Magic, MD  12/01/2017 4:35 PM    [facility]

## 2018-04-21 ENCOUNTER — Ambulatory Visit: Payer: BC Managed Care – PPO | Admitting: Cardiovascular Disease

## 2018-04-21 ENCOUNTER — Encounter: Payer: Self-pay | Admitting: Cardiovascular Disease

## 2018-04-21 VITALS — BP 110/70 | HR 78 | Ht 68.0 in | Wt 218.4 lb

## 2018-04-21 DIAGNOSIS — Z0000 Encounter for general adult medical examination without abnormal findings: Secondary | ICD-10-CM | POA: Diagnosis not present

## 2018-04-21 DIAGNOSIS — I739 Peripheral vascular disease, unspecified: Secondary | ICD-10-CM

## 2018-04-21 NOTE — Progress Notes (Signed)
Cardiology Office Note   Date:  04/21/2018   ID:  [name], DOB [birth year], MRN [id number]  PCP:  Darrow Bussing, MD  Cardiologist:   Lorine Bears, MD   Chief Complaint  Patient presents with  . Follow-up    pt denied chest pain and SOB      History of Present Illness: Jackson Blackburn is a 47 y.o. male who presents for a follow-up visit regarding  claudication. He has known history of VSD repair in 1974. He was 47 years old at that time and required catheterization via the right femoral artery. He has a scar in that area. Other medical problems include obesity and obstructive sleep apnea.  He was seen for right calf claudication in 2016. He underwent noninvasive evaluation which showed normal ABI bilaterally. 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 Angiography in 01/2015 showed flush occlusion of the right SFA with reconstitution in the midsegment. Extensive collaterals from the profunda. There was overall no significant atherosclerosis noted.  The SFA occlusion was felt to be traumatic from previous surgery and catheterization was 47 years old.   He has been doing well overall with no chest pain, shortness of breath or palpitations.  He uses CPAP regularly and follows with Dr. Mayford Knife.  He reports that his right calf claudication is minimal but he does notice it if he forgets to take cilostazol.   Past Medical History:  Diagnosis Date  . OSA (obstructive sleep apnea)    Severe  . VSD (ventricular septal defect)    Congenital    Past Surgical History:  Procedure Laterality Date  . no surgical hx    . PERIPHERAL VASCULAR CATHETERIZATION N/A 02/01/2015   Procedure: Abdominal Aortogram;  Surgeon: Iran Ouch, MD;  Location: [facility];  Service: Cardiovascular;  Laterality: N/A;     Current Outpatient Medications  Medication Sig Dispense Refill  . acetaminophen (TYLENOL) 325 MG tablet Take 650 mg by mouth every 6  (six) hours as needed (For pain).    Marland Kitchen aspirin 81 MG tablet Take 81 mg by mouth daily.    . cilostazol (PLETAL) 50 MG tablet Take 1 tablet (50 mg total) by mouth 2 (two) times daily. 60 tablet 11  . Multiple Vitamin (MULTIVITAMIN) tablet Take 1 tablet by mouth daily.     No current facility-administered medications for this visit.     Allergies:   Penicillins    Social History:  The patient  reports that he has never smoked. He has never used smokeless tobacco. He reports that he drinks alcohol. He reports that he does not use drugs.   Family History:  The patient's family history includes CVA in his father; Coronary artery disease in his father and paternal uncle; Heart disease in his father and paternal uncle; Hypertension in his father.    ROS:  Please see the history of present illness.   Otherwise, review of systems are positive for none.   All other systems are reviewed and negative.    PHYSICAL EXAM: VS:  BP 110/70   Pulse 78   Ht 5\' 8"  (1.727 m)   Wt 218 lb 6.4 oz (99.1 kg)   BMI 33.21 kg/m  , BMI Body mass index is 33.21 kg/m. GEN: Well nourished, well developed, in no acute distress  HEENT: normal  Neck: no JVD, carotid bruits, or masses Cardiac: RRR; no murmurs, rubs, or gallops,no edema  Respiratory:  clear to auscultation  bilaterally, normal work of breathing GI: soft, nontender, nondistended, + BS MS: no deformity or atrophy  Skin: warm and dry, no rash Neuro:  Strength and sensation are intact Psych: euthymic mood, full affect   EKG:  EKG is ordered today. The ekg ordered today demonstrates normal sinus rhythm with incomplete right bundle-branch block. No significant change from before.   Recent Labs: No results found for requested labs within last 8760 hours.    Lipid Panel No results found for: CHOL, TRIG, HDL, CHOLHDL, VLDL, LDLCALC, LDLDIRECT    Wt Readings from Last 3 Encounters:  04/21/18 218 lb 6.4 oz (99.1 kg)  12/01/17 217 lb (98.4 kg)    04/15/17 218 lb 3.2 oz (99 kg)       No flowsheet data found.    ASSESSMENT AND PLAN:  1.  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  There was no evidence of atherosclerosis.  Continue small dose cilostazol. Symptoms are overall mild and not lifestyle limiting.  No  2. Obstructive sleep apnea: On CPAP.    Disposition:   FU with me in 1 year  Signed,  Lorine BearsMuhammad Zadiel Leyh, MD  04/21/2018 10:51 AM    [facility]

## 2018-04-21 NOTE — Patient Instructions (Signed)
Medication Instructions:  Your physician recommends that you continue on your current medications as directed. Please refer to the Current Medication list given to you today.  If you need a refill on your cardiac medications before your next appointment, please call your pharmacy.   Lab work: none If you have labs (blood work) drawn today and your tests are completely normal, you will receive your results only by: . MyChart Message (if you have MyChart) OR . A paper copy in the mail If you have any lab test that is abnormal or we need to change your treatment, we will call you to review the results.  Testing/Procedures: none  Follow-Up: At [facility], you and your health needs are our priority.  As part of our continuing mission to provide you with exceptional heart care, we have created designated Provider Care Teams.  These Care Teams include your primary Cardiologist (physician) and Advanced Practice Providers (APPs -  Physician Assistants and Nurse Practitioners) who all work together to provide you with the care you need, when you need it. You will need a follow up appointment in 12 months.  Please call our office 2 months in advance to schedule this appointment.  You may see Dr. Arida. or one of the following Advanced Practice Providers on your designated Care Team:   Luke Kilroy, PA-C Krista Kroeger, PA-C . Callie Goodrich, PA-C  Any Other Special Instructions Will Be Listed Below (If Applicable).    

## 2018-05-28 IMAGING — CT CT HEAD W/O CM
1 series · 16 of 30 positions shown, 20 images · non-contrast
Comparison: None.

CLINICAL DATA: Acute posttraumatic headache not intractable. Fall
05/14/2017

EXAM:
CT HEAD WITHOUT CONTRAST
TECHNIQUE: Contiguous axial images were obtained from the base of the skull
through the vertex without intravenous contrast.

[Series 2: head w/(date) · axial · 0.45mm/px · z∈[-191,-41]mm · 16 of 34 slices shown, 20 images]
[im 2/34  brain]
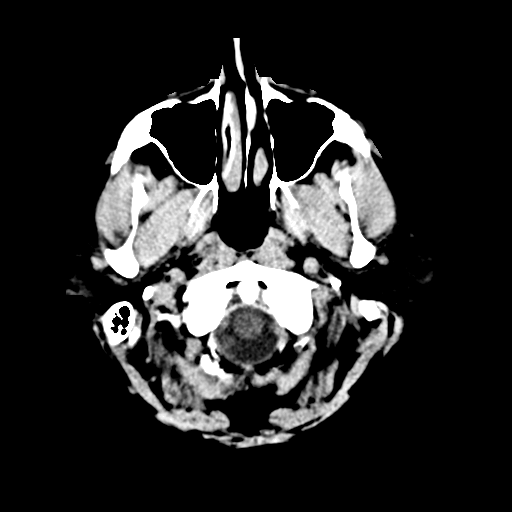
[im 2/34  bone]
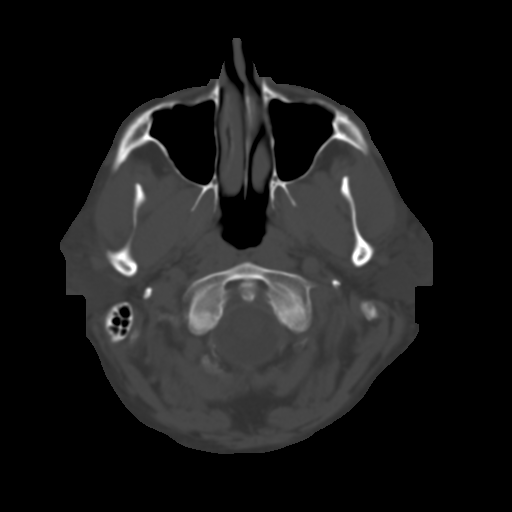
[im 4/34  brain]
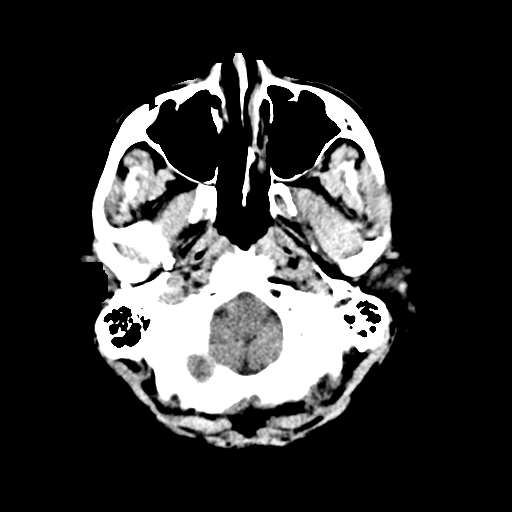
[im 6/34  brain]
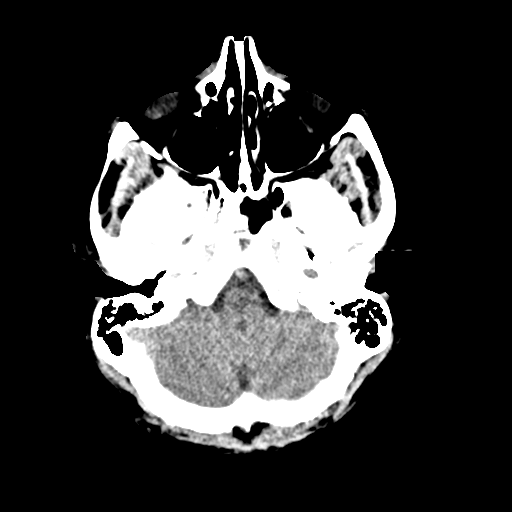
[im 8/34  brain]
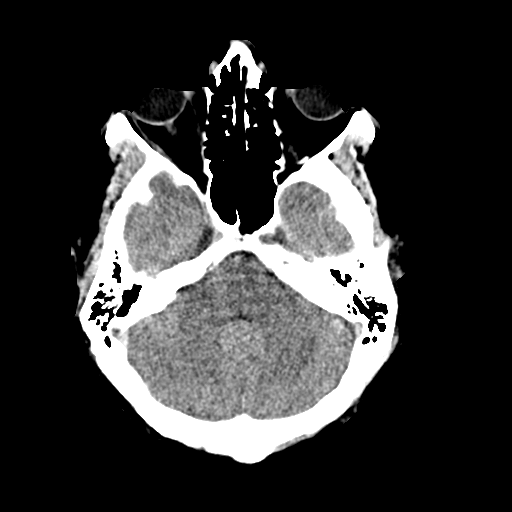
[im 10/34  brain]
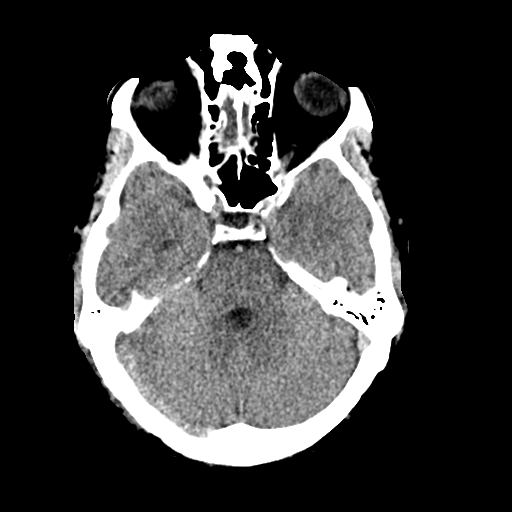
[im 10/34  bone]
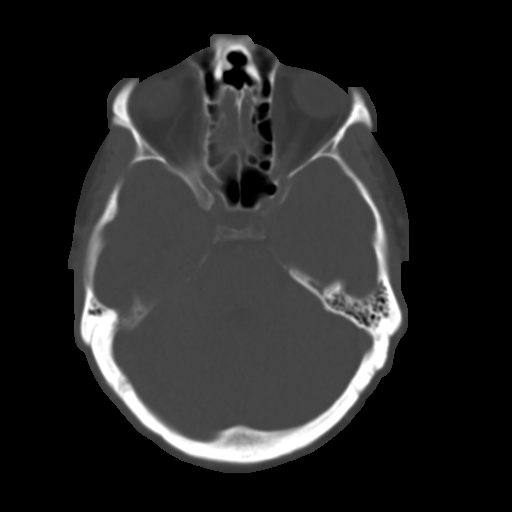
[im 12/34  brain]
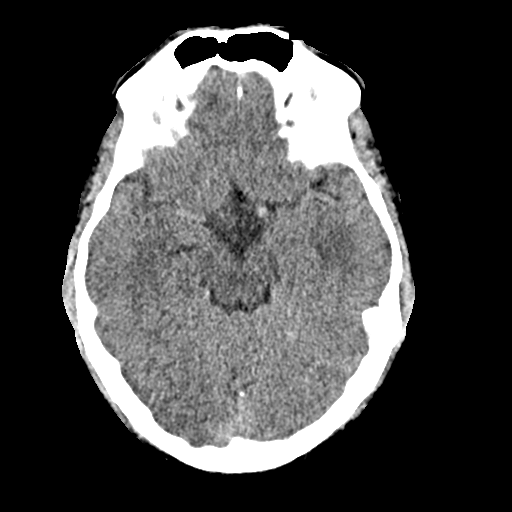
[im 14/34  brain]
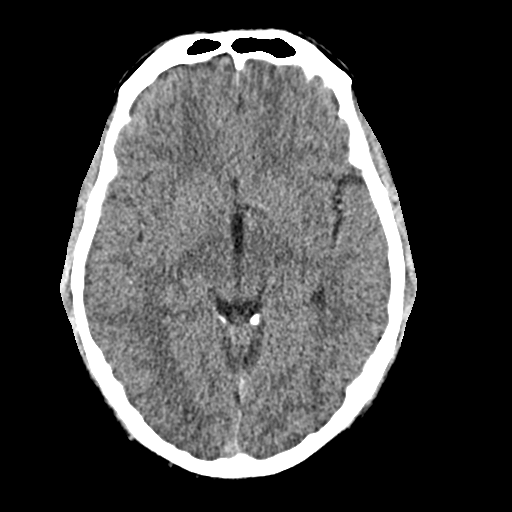
[im 16/34  brain]
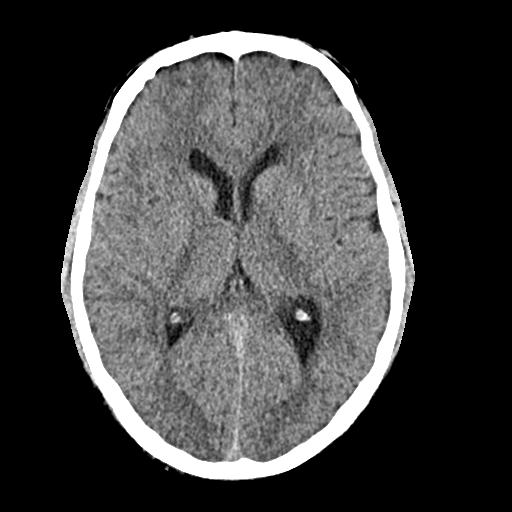
[im 18/34  brain]
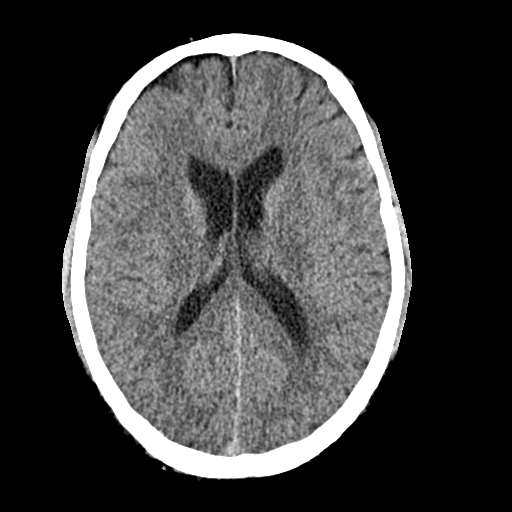
[im 18/34  bone]
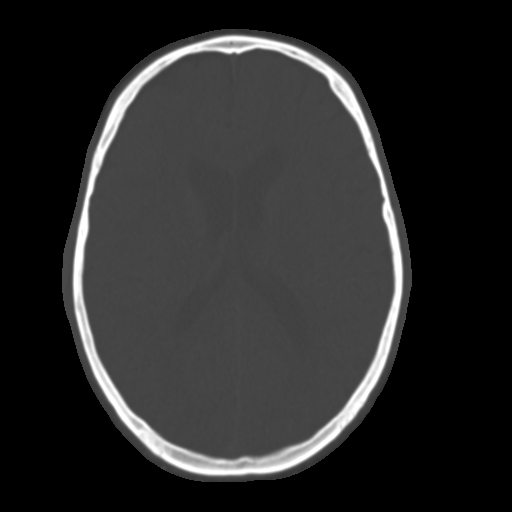
[im 20/34  brain]
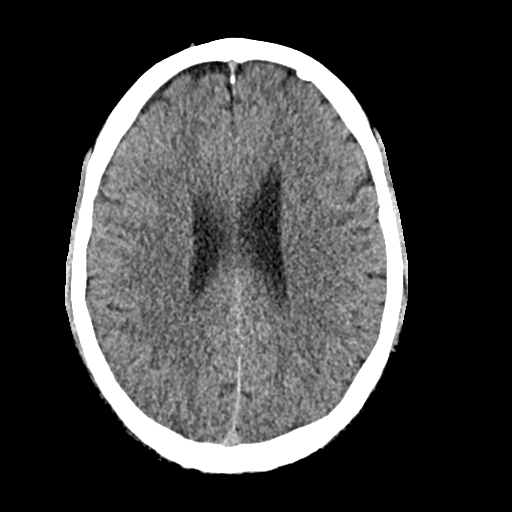
[im 22/34  brain]
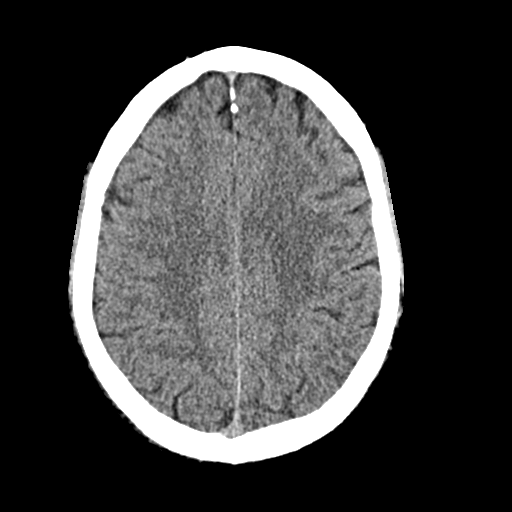
[im 24/34  brain]
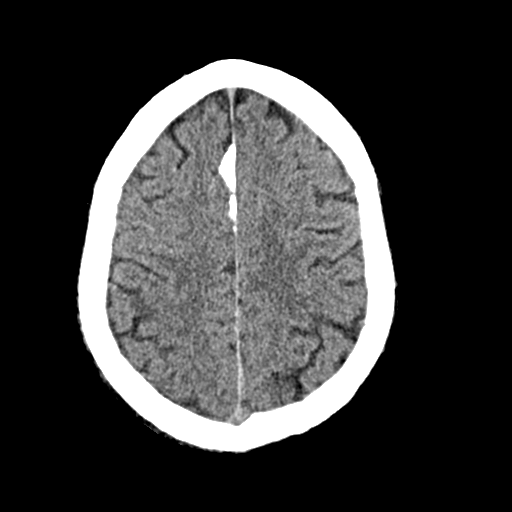
[im 26/34  brain]
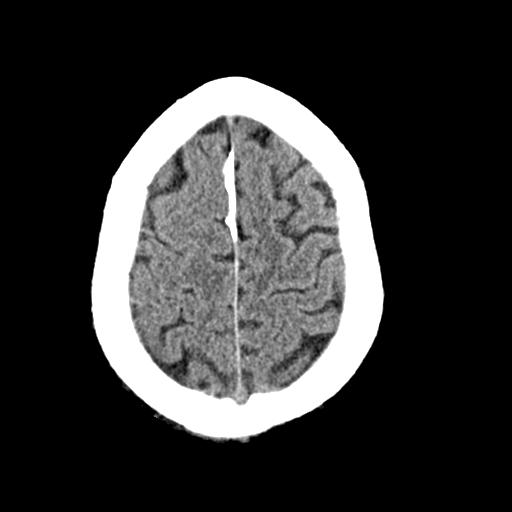
[im 26/34  bone]
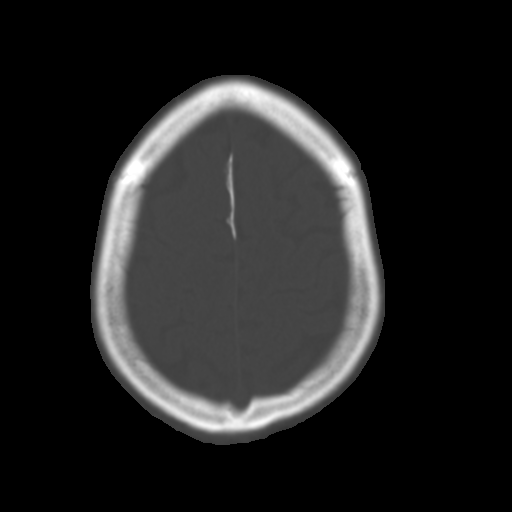
[im 28/34  brain]
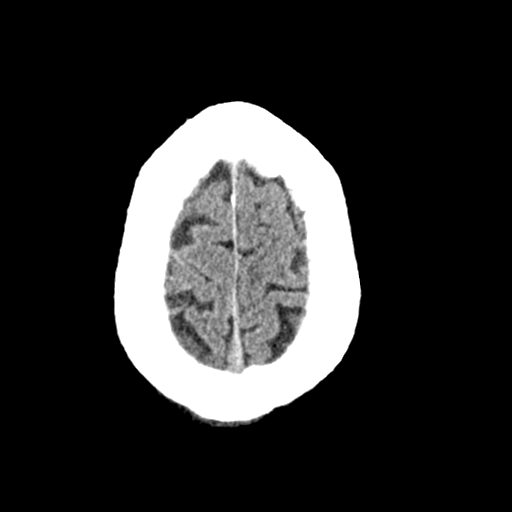
[im 30/34  brain]
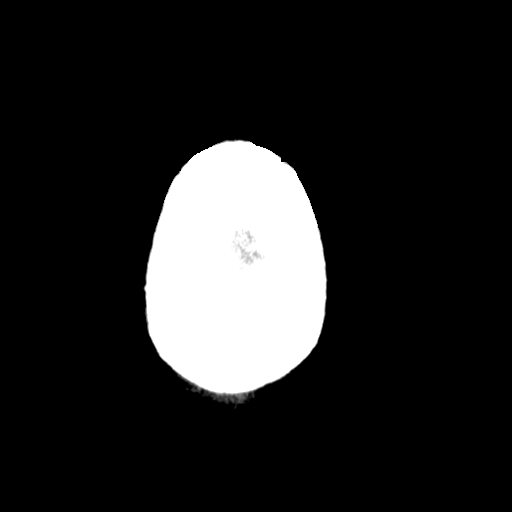
[im 32/34  brain]
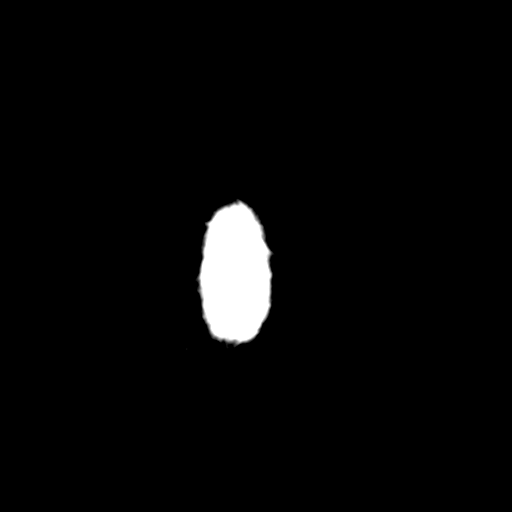

[16 of 30 positions shown; findings below may reference images not displayed]

FINDINGS: Brain: No evidence of acute infarction, hemorrhage, hydrocephalus,
extra-axial collection or mass lesion/mass effect.

Vascular: Negative for hyperdense vessel

Skull: Negative

Sinuses/Orbits: Negative

Other: None
IMPRESSION: Negative CT head

## 2018-09-07 ENCOUNTER — Other Ambulatory Visit: Payer: Self-pay

## 2018-09-07 MED ORDER — CILOSTAZOL 50 MG PO TABS: 50.0000 mg | ORAL_TABLET | Freq: Two times a day (BID) | ORAL | 11 refills | Status: DC

## 2019-02-19 ENCOUNTER — Telehealth: Payer: Self-pay | Admitting: *Deleted

## 2019-02-19 ENCOUNTER — Encounter: Payer: Self-pay | Admitting: Cardiology

## 2019-02-19 NOTE — Telephone Encounter (Signed)
Virtual Visit Pre-Appointment Phone Call  "(Name), I am calling you today to discuss your upcoming appointment. We are currently trying to limit exposure to the virus that causes COVID-19 by seeing patients at home rather than in the office."  1. "What is the BEST phone number to call the day of the visit?" - include this in appointment notes  2. "Do you have or have access to (through a family member/friend) a smartphone with video capability that we can use for your visit?" a. If yes - list this number in appt notes as "cell" (if different from BEST phone #) and list the appointment type as a VIDEO visit in appointment notes b. If no - list the appointment type as a PHONE visit in appointment notes  3. Confirm consent - "In the setting of the current Covid19 crisis, you are scheduled for a (phone or video) visit with your provider on (date) at (time).  Just as we do with many in-office visits, in order for you to participate in this visit, we must obtain consent.  If you'd like, I can send this to your mychart (if signed up) or email for you to review.  Otherwise, I can obtain your verbal consent now.  All virtual visits are billed to your insurance company just like a normal visit would be.  By agreeing to a virtual visit, we'd like you to understand that the technology does not allow for your provider to perform an examination, and thus may limit your provider's ability to fully assess your condition. If your provider identifies any concerns that need to be evaluated in person, we will make arrangements to do so.  Finally, though the technology is pretty good, we cannot assure that it will always work on either your or our end, and in the setting of a video visit, we may have to convert it to a phone-only visit.  In either situation, we cannot ensure that we have a secure connection.  Are you willing to proceed?" STAFF: Did the patient verbally acknowledge consent to telehealth visit? Document  YES/NO here: YES  4. Advise patient to be prepared - "Two hours prior to your appointment, go ahead and check your blood pressure, pulse, oxygen saturation, and your weight (if you have the equipment to check those) and write them all down. When your visit starts, your provider will ask you for this information. If you have an Apple Watch or Kardia device, please plan to have heart rate information ready on the day of your appointment. Please have a pen and paper handy nearby the day of the visit as well."  5. Give patient instructions for MyChart download to smartphone OR [email or web address] as below if video visit (depending on what platform provider is using)  6. Inform patient they will receive a phone call 15 minutes prior to their appointment time (may be from unknown caller ID) so they should be prepared to answer    TELEPHONE CALL NOTE  Jackson Blackburn has been deemed a candidate for a follow-up tele-health visit to limit community exposure during the Covid-19 pandemic. I spoke with the patient via phone to ensure availability of phone/video source, confirm preferred email & phone number, and discuss instructions and expectations.  I reminded Jackson Blackburn to be prepared with any vital sign and/or heart rhythm information that could potentially be obtained via home monitoring, at the time of his visit. I reminded Jackson Blackburn to expect a phone call prior to  his visit.  Latrelle Dodrill, CMA 02/19/2019 11:41 AM   INSTRUCTIONS FOR DOWNLOADING THE MYCHART APP TO SMARTPHONE  - The patient must first make sure to have activated MyChart and know their login information - If Apple, go to [employer] and type in MyChart in the search bar and download the app. If Android, ask patient to go to [employer] and type in [place] in the search bar and download the app. The app is free but as with any other app downloads, their phone may require them to verify saved payment information or  Apple/Android password.  - The patient will need to then log into the app with their MyChart username and password, and select [facility] as their healthcare provider to link the account. When it is time for your visit, go to the MyChart app, find appointments, and click Begin Video Visit. Be sure to Select Allow for your device to access the Microphone and Camera for your visit. You will then be connected, and your provider will be with you shortly.  **If they have any issues connecting, or need assistance please contact MyChart service desk [phone])**  **If using a computer, in order to ensure the best quality for their visit they will need to use either of the following Internet Browsers: [employer] or Google Chrome**  IF USING DOXIMITY or DOXY.ME - The patient will receive a link just prior to their visit by text.     FULL LENGTH CONSENT FOR TELE-HEALTH VISIT   I hereby voluntarily request, consent and authorize [facility] and its employed or contracted physicians, physician assistants, nurse practitioners or other licensed health care professionals (the Practitioner), to provide me with telemedicine health care services (the "Services") as deemed necessary by the treating Practitioner. I acknowledge and consent to receive the Services by the Practitioner via telemedicine. I understand that the telemedicine visit will involve communicating with the Practitioner through live audiovisual communication technology and the disclosure of certain medical information by electronic transmission. I acknowledge that I have been given the opportunity to request an in-person assessment or other available alternative prior to the telemedicine visit and am voluntarily participating in the telemedicine visit.  I understand that I have the right to withhold or withdraw my consent to the use of telemedicine in the course of my care at any time, without affecting my right to future care  or treatment, and that the Practitioner or I may terminate the telemedicine visit at any time. I understand that I have the right to inspect all information obtained and/or recorded in the course of the telemedicine visit and may receive copies of available information for a reasonable fee.  I understand that some of the potential risks of receiving the Services via telemedicine include:  Marland Kitchen Delay or interruption in medical evaluation due to technological equipment failure or disruption; . Information transmitted may not be sufficient (e.g. poor resolution of images) to allow for appropriate medical decision making by the Practitioner; and/or  . In rare instances, security protocols could fail, causing a breach of personal health information.  Furthermore, I acknowledge that it is my responsibility to provide information about my medical history, conditions and care that is complete and accurate to the best of my ability. I acknowledge that Practitioner's advice, recommendations, and/or decision may be based on factors not within their control, such as incomplete or inaccurate data provided by me or distortions of diagnostic images or specimens that may result from electronic transmissions.  I understand that the practice of medicine is not an exact science and that Practitioner makes no warranties or guarantees regarding treatment outcomes. I acknowledge that I will receive a copy of this consent concurrently upon execution via email to the email address I last provided but may also request a printed copy by calling the office of [facility].    I understand that my insurance will be billed for this visit.   I have read or had this consent read to me. . I understand the contents of this consent, which adequately explains the benefits and risks of the Services being provided via telemedicine.  . I have been provided ample opportunity to ask questions regarding this consent and the Services and have had  my questions answered to my satisfaction. . I give my informed consent for the services to be provided through the use of telemedicine in my medical care  By participating in this telemedicine visit I agree to the above.

## 2019-02-22 ENCOUNTER — Other Ambulatory Visit: Payer: Self-pay

## 2019-02-23 ENCOUNTER — Encounter: Payer: Self-pay | Admitting: Cardiology

## 2019-02-23 ENCOUNTER — Telehealth: Payer: Self-pay | Admitting: *Deleted

## 2019-02-23 ENCOUNTER — Telehealth (INDEPENDENT_AMBULATORY_CARE_PROVIDER_SITE_OTHER): Payer: BC Managed Care – PPO | Admitting: Cardiology

## 2019-02-23 ENCOUNTER — Other Ambulatory Visit: Payer: Self-pay

## 2019-02-23 VITALS — HR 98 | Ht 68.0 in | Wt 212.0 lb

## 2019-02-23 DIAGNOSIS — E669 Obesity, unspecified: Secondary | ICD-10-CM | POA: Diagnosis not present

## 2019-02-23 DIAGNOSIS — R079 Chest pain, unspecified: Secondary | ICD-10-CM | POA: Diagnosis not present

## 2019-02-23 DIAGNOSIS — G4733 Obstructive sleep apnea (adult) (pediatric): Secondary | ICD-10-CM | POA: Diagnosis not present

## 2019-02-23 DIAGNOSIS — Q210 Ventricular septal defect: Secondary | ICD-10-CM

## 2019-02-23 MED ORDER — METOPROLOL TARTRATE 100 MG PO TABS: ORAL_TABLET | ORAL | 0 refills | Status: DC

## 2019-02-23 NOTE — Telephone Encounter (Signed)
Order placed to [facility] via community message to: Order new CPAP supplies. Order a ResMed BiPAP with heated humidity set at 12/8cm H2O. Followup with me in 10 weeks virutual

## 2019-02-23 NOTE — Patient Instructions (Addendum)
Medication Instructions:   If you need a refill on your cardiac medications before your next appointment, please call your pharmacy.   Lab work:  If you have labs (blood work) drawn today and your tests are completely normal, you will receive your results only by: Marland Kitchen MyChart Message (if you have MyChart) OR . A paper copy in the mail If you have any lab test that is abnormal or we need to change your treatment, we will call you to review the results.  Testing/Procedures: Your physician has requested that you have cardiac CT. Cardiac computed tomography (CT) is a painless test that uses an x-ray machine to take clear, detailed pictures of your heart. For further information please visit [email or web address] Please follow instruction sheet as given.  Follow-Up: At [facility], you and your health needs are our priority.  As part of our continuing mission to provide you with exceptional heart care, we have created designated Provider Care Teams.  These Care Teams include your primary Cardiologist (physician) and Advanced Practice Providers (APPs -  Physician Assistants and Nurse Practitioners) who all work together to provide you with the care you need, when you need it. You will need a follow up appointment in 12 months.  Please call our office 2 months in advance to schedule this appointment.  You may see Dr. Mayford Knife or one of the following Advanced Practice Providers on your designated Care Team:   [place], PA-C Ronie Spies, PA-C . Jacolyn Reedy, PA-C   Your cardiac CT will be scheduled at one of the below locations:   [facility] [address] [place], [place] [address] [phone]  OR  [facility] [address] [place], [place] [address] [phone]  If scheduled at [facility], please arrive at the [facility] main entrance of [facility] 30-45 minutes prior to test start time. Proceed to  the [facility] Radiology Department (first floor) to check-in and test prep.  If scheduled at [facility], please arrive 15 mins early for check-in and test prep.  Please follow these instructions carefully (unless otherwise directed):  Hold all erectile dysfunction medications at least 3 days (72 hrs) prior to test.  On the Night Before the Test: . Be sure to Drink plenty of water. . Do not consume any caffeinated/decaffeinated beverages or chocolate 12 hours prior to your test. . Do not take any antihistamines 12 hours prior to your test.  On the Day of the Test: . Drink plenty of water. Do not drink any water within one hour of the test. . Do not eat any food 4 hours prior to the test. . You may take your regular medications prior to the test.  . Take metoprolol (Lopressor) 2 hours prior to test Lopressor 100 mg by mouth.   After the Test: . Drink plenty of water. . After receiving IV contrast, you may experience a mild flushed feeling. This is normal. . On occasion, you may experience a mild rash up to 24 hours after the test. This is not dangerous. If this occurs, you can take Benadryl 25 mg and increase your fluid intake. . If you experience trouble breathing, this can be serious. If it is severe call 911 IMMEDIATELY. If it is mild, please call our office.   Please contact the cardiac imaging nurse navigator should you have any questions/concerns Rockwell Alexandria, RN Navigator Cardiac Imaging [facility] V[facility] Services [phone] Office  [phone] Cell

## 2019-02-23 NOTE — Progress Notes (Signed)
Virtual Visit via Video Note   This visit type was conducted due to national recommendations for restrictions regarding the COVID-19 Pandemic (e.g. social distancing) in an effort to limit this patient's exposure and mitigate transmission in our community.  Due to his co-morbid illnesses, this patient is at least at moderate risk for complications without adequate follow up.  This format is felt to be most appropriate for this patient at this time.  All issues noted in this document were discussed and addressed.  A limited physical exam was performed with this format.  Please refer to the patient's chart for his consent to telehealth for [facility].  Evaluation Performed:  Follow-up visit  This visit type was conducted due to national recommendations for restrictions regarding the COVID-19 Pandemic (e.g. social distancing).  This format is felt to be most appropriate for this patient at this time.  All issues noted in this document were discussed and addressed.  No physical exam was performed (except for noted visual exam findings with Video Visits).  Please refer to the patient's chart (MyChart message for video visits and phone note for telephone visits) for the patient's consent to telehealth for [facility].  Date:  02/23/2019   ID:  Jackson Blackburn, DOB March 02, 1971, MRN [id number]  Patient Location:  Home  Provider location:   [place]  PCP:  Lujean Amel, MD  Sleep Medicine:  Fransico Him, MD Electrophysiologist:  None   Chief Complaint:  OSA  History of Present Illness:    Jackson Blackburn is a 48 y.o. male who presents via audio/video conferencing for a telehealth visit today.    Jackson Blackburn is a 48 y.o. male with a hx of OSA on BIPAP therapy and VSD s/p repair as a child. He is here today for followup and is doing well.  He has been having some mild episodes chest discomfort with no associated sx of N/V and SOB but does radiate to his shoulders and jaw.  He denies  any  SOB, DOE, PND, orthopnea, LE edema, dizziness, palpitations or syncope. He is compliant with his meds and is tolerating meds with no SE.  He is doing well with his CPAP device and thinks that he has gotten used to it.  He tolerates the mask and feels the pressure is adequate.  Since going on CPAP he feels rested in the am and has no significant daytime sleepiness.  He denies any significant mouth or nasal dryness or nasal congestion.  He does not think that he snores.    The patient does not have symptoms concerning for COVID-19 infection (fever, chills, cough, or new shortness of breath).    Prior CV studies:   The following studies were reviewed today:  PAP compliance download  Past Medical History:  Diagnosis Date  . OSA (obstructive sleep apnea)    Severe  . VSD (ventricular septal defect)    Congenital   Past Surgical History:  Procedure Laterality Date  . no surgical hx    . PERIPHERAL VASCULAR CATHETERIZATION N/A 02/01/2015   Procedure: Abdominal Aortogram;  Surgeon: Wellington Hampshire, MD;  Location: [facility];  Service: Cardiovascular;  Laterality: N/A;     Current Meds  Medication Sig  . acetaminophen (TYLENOL) 325 MG tablet Take 650 mg by mouth every 6 (six) hours as needed (For pain).  . cilostazol (PLETAL) 50 MG tablet Take 1 tablet (50 mg total) by mouth 2 (two) times daily.  . Multiple Vitamin (MULTIVITAMIN)  tablet Take 1 tablet by mouth daily.     Allergies:   Penicillins   Social History   Tobacco Use  . Smoking status: Never Smoker  . Smokeless tobacco: Never Used  Substance Use Topics  . Alcohol use: Yes    Comment: rare  . Drug use: No     Family Hx: The patient's family history includes CVA in his father; Coronary artery disease in his father and paternal uncle; Heart disease in his father and paternal uncle; Hypertension in his father.  ROS:   Please see the history of present illness.     All other systems reviewed and are negative.    Labs/Other Tests and Data Reviewed:    Recent Labs: No results found for requested labs within last 8760 hours.   Recent Lipid Panel No results found for: CHOL, TRIG, HDL, CHOLHDL, LDLCALC, LDLDIRECT  Wt Readings from Last 3 Encounters:  02/23/19 212 lb (96.2 kg)  04/21/18 218 lb 6.4 oz (99.1 kg)  12/01/17 217 lb (98.4 kg)     Objective:    Vital Signs:  Pulse 98   Ht 5\' 8"  (1.727 m)   Wt 212 lb (96.2 kg)   BMI 32.23 kg/m    CONSTITUTIONAL:  Well nourished, well developed male in no acute distress.  EYES: anicteric MOUTH: oral mucosa is pink RESPIRATORY: Normal respiratory effort, symmetric expansion CARDIOVASCULAR: No peripheral edema SKIN: No rash, lesions or ulcers MUSCULOSKELETAL: no digital cyanosis NEURO: Cranial Nerves II-XII grossly intact, moves all extremities PSYCH: Intact judgement and insight.  A&O x 3, Mood/affect appropriate   ASSESSMENT & PLAN:    1.  OSA - The patient is tolerating PAP therapy well without any problems. The PAP download was reviewed today and showed an AHI of 1.7/hr on 12.8 cm H2O with 96% compliance in using more than 4 hours nightly.  The patient has been using and benefiting from PAP use and will continue to benefit from therapy.   2.  VSD -s/p repair as a child  3.  Obesity -I have encouraged him to get into a routine exercise program and cut back on carbs and portions.   4.  Chest pain -he has had some instances of fleeting chest pain that is nonexertional but radiates into his shoulders and jaw.  He thinks it is related to stress -I will get a coronary CTA with FFR to rule out CAD  COVID-19 Education: The signs and symptoms of COVID-19 were discussed with the patient and how to seek care for testing (follow up with PCP or arrange E-visit).  The importance of social distancing was discussed today.  Patient Risk:   After full review of this patient's clinical status, I feel that they are at least moderate risk at this time.   Time:   Today, I have spent 20 minutes directly with the patient on telemedicine discussing medical problems including VSD, Obesity, CP, OSA.  We also reviewed the symptoms of COVID 19 and the ways to protect against contracting the virus with telehealth technology.  I spent an additional 5 minutes reviewing patient's chart including PAP compliance download.  Medication Adjustments/Labs and Tests Ordered: Current medicines are reviewed at length with the patient today.  Concerns regarding medicines are outlined above.  Tests Ordered: No orders of the defined types were placed in this encounter.  Medication Changes: No orders of the defined types were placed in this encounter.   Disposition:  Follow up in 1 year(s)  Signed, Armanda Magicraci Turner,  MD  02/23/2019 8:21 AM    [facility]

## 2019-02-23 NOTE — Telephone Encounter (Signed)
-----   Message from Sueanne Margarita, MD sent at 02/23/2019  8:18 AM EDT ----- Order new CPAP supplies.  Order a ResMed BiPAP with heated humidity set at 12/8cm H2O.  Followup with me in 10 weeks virutual

## 2019-03-11 ENCOUNTER — Telehealth: Payer: Self-pay | Admitting: Cardiology

## 2019-03-11 NOTE — Telephone Encounter (Signed)
I spoke to the patient and informed him that someone would contact him about his test and sleep equipment.  He verbalized understanding.

## 2019-03-11 NOTE — Telephone Encounter (Signed)
Please forward this to Gae Bon as she placed the order for PAP device.  ALso make sure MRI has been ordered

## 2019-03-11 NOTE — Telephone Encounter (Signed)
New Message     Pt is wondering about his MRI, if it would be done before next year, for insurance reasons.  Also about getting a new Pap machine. He says at his last visit this was discussed.      Please call

## 2019-03-12 NOTE — Telephone Encounter (Signed)
Patient notified. Patient agrees with treatment and thanked me for call.

## 2019-03-12 NOTE — Telephone Encounter (Signed)
Per the respiratory department at [place]:  It is with our RT Team to contact the patient to schedule set up.

## 2019-04-19 ENCOUNTER — Telehealth (HOSPITAL_COMMUNITY): Payer: Self-pay | Admitting: Emergency Medicine

## 2019-04-19 NOTE — Telephone Encounter (Signed)
Reaching out to patient to offer assistance regarding upcoming cardiac imaging study; pt verbalizes understanding of appt date/time, parking situation and where to check in, pre-test NPO status and medications ordered, and verified current allergies; name and call back number provided for further questions should they arise Daanya Lanphier RN Navigator Cardiac Imaging [facility] [phone] office [phone] cell 

## 2019-04-20 ENCOUNTER — Ambulatory Visit: Payer: BC Managed Care – PPO | Admitting: Cardiovascular Disease

## 2019-04-20 ENCOUNTER — Other Ambulatory Visit: Payer: Self-pay

## 2019-04-20 ENCOUNTER — Telehealth (HOSPITAL_COMMUNITY): Payer: Self-pay | Admitting: Emergency Medicine

## 2019-04-20 ENCOUNTER — Ambulatory Visit (HOSPITAL_COMMUNITY): Payer: BC Managed Care – PPO

## 2019-04-20 VITALS — BP 148/78 | HR 82 | Temp 97.1°F | Ht 68.0 in | Wt 220.0 lb

## 2019-04-20 DIAGNOSIS — I739 Peripheral vascular disease, unspecified: Secondary | ICD-10-CM

## 2019-04-20 NOTE — Patient Instructions (Signed)
Medication Instructions:  Your physician recommends that you continue on your current medications as directed. Please refer to the Current Medication list given to you today.  *If you need a refill on your cardiac medications before your next appointment, please call your pharmacy*  Lab Work: none If you have labs (blood work) drawn today and your tests are completely normal, you will receive your results only by: Marland Kitchen MyChart Message (if you have MyChart) OR . A paper copy in the mail If you have any lab test that is abnormal or we need to change your treatment, we will call you to review the results.  Testing/Procedures: none  Follow-Up: At [facility], you and your health needs are our priority.  As part of our continuing mission to provide you with exceptional heart care, we have created designated Provider Care Teams.  These Care Teams include your primary Cardiologist (physician) and Advanced Practice Providers (APPs -  Physician Assistants and Nurse Practitioners) who all work together to provide you with the care you need, when you need it.  Your next appointment:   12 month(s)  The format for your next appointment:   Either In Person or Virtual  Provider:   You may see Dr. Fletcher Anon or one of the following Advanced Practice Providers on your designated Care Team:    Kerin Ransom, PA-C  [place], [place]  Coletta Memos, [place]

## 2019-04-20 NOTE — Progress Notes (Signed)
Cardiology Office Note   Date:  04/20/2019   ID:  Jackson Blackburn, DOB 12/01/1970, MRN [id number]  PCP:  Darrow Bussing, MD  Cardiologist: Dr. Louie Boston, MD   No chief complaint on file.     History of Present Illness: Jackson Blackburn is a 48 y.o. male who presents for a follow-up visit regarding  claudication. He has known history of VSD repair in 1974. He was 48 years old at that time and required catheterization via the right femoral artery. He has a scar in that area. Other medical problems include obesity and obstructive sleep apnea.  He was seen for right calf claudication in 2016. He underwent noninvasive evaluation which showed normal ABI bilaterally. 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 Angiography in 01/2015 showed flush occlusion of the right SFA with reconstitution in the midsegment. Extensive collaterals from the profunda. There was overall no significant atherosclerosis noted.  The SFA occlusion was felt to be traumatic from previous surgery and catheterization was 48 years old.   He was recently seen by Dr. Joselyn Glassman complained of atypical chest pain.  He is scheduled to have CTA of the coronary arteries.  He described chest pain when he is under stress.  He continues to work at [employer].  He has minimal right calf claudication but his symptoms become obvious if he forgets to take cilostazol.   Past Medical History:  Diagnosis Date  . OSA (obstructive sleep apnea)    Severe  . VSD (ventricular septal defect)    Congenital    Past Surgical History:  Procedure Laterality Date  . no surgical hx    . PERIPHERAL VASCULAR CATHETERIZATION N/A 02/01/2015   Procedure: Abdominal Aortogram;  Surgeon: Iran Ouch, MD;  Location: [facility];  Service: Cardiovascular;  Laterality: N/A;     Current Outpatient Medications  Medication Sig Dispense Refill  . acetaminophen (TYLENOL) 325 MG tablet Take 650 mg by mouth  every 6 (six) hours as needed (For pain).    . cilostazol (PLETAL) 50 MG tablet Take 1 tablet (50 mg total) by mouth 2 (two) times daily. 60 tablet 11  . metoprolol tartrate (LOPRESSOR) 100 MG tablet Take one tablet 2 hours before CT test. 1 tablet 0  . Multiple Vitamin (MULTIVITAMIN) tablet Take 1 tablet by mouth daily.     No current facility-administered medications for this visit.     Allergies:   Penicillins    Social History:  The patient  reports that he has never smoked. He has never used smokeless tobacco. He reports current alcohol use. He reports that he does not use drugs.   Family History:  The patient's family history includes CVA in his father; Coronary artery disease in his father and paternal uncle; Heart disease in his father and paternal uncle; Hypertension in his father.    ROS:  Please see the history of present illness.   Otherwise, review of systems are positive for none.   All other systems are reviewed and negative.    PHYSICAL EXAM: VS:  BP (!) 148/78   Pulse 82   Temp (!) 97.1 F (36.2 C)   Ht 5\' 8"  (1.727 m)   Wt 220 lb (99.8 kg)   SpO2 96%   BMI 33.45 kg/m  , BMI Body mass index is 33.45 kg/m. GEN: Well nourished, well developed, in no acute distress  HEENT: normal  Neck: no JVD, carotid bruits, or masses Cardiac:  RRR; no murmurs, rubs, or gallops,no edema  Respiratory:  clear to auscultation bilaterally, normal work of breathing GI: soft, nontender, nondistended, + BS MS: no deformity or atrophy  Skin: warm and dry, no rash Neuro:  Strength and sensation are intact Psych: euthymic mood, full affect   EKG:  EKG is ordered today. The ekg ordered today demonstrates normal sinus rhythm with incomplete right bundle-branch block. No significant change from before.   Recent Labs: No results found for requested labs within last 8760 hours.    Lipid Panel No results found for: CHOL, TRIG, HDL, CHOLHDL, VLDL, LDLCALC, LDLDIRECT    Wt Readings  from Last 3 Encounters:  04/20/19 220 lb (99.8 kg)  02/23/19 212 lb (96.2 kg)  04/21/18 218 lb 6.4 oz (99.1 kg)       No flowsheet data found.    ASSESSMENT AND PLAN:  1.  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  There was no evidence of atherosclerosis.  Continue small dose cilostazol. Symptoms are overall mild and not lifestyle limiting.    2. Obstructive sleep apnea: On CPAP.   3.  Atypical chest pain: The patient is scheduled for CTA of the coronary arteries.   Disposition:   FU with me in 1 year  Signed,  Kathlyn [place], MD  04/20/2019 11:47 AM    [place]

## 2019-04-20 NOTE — Telephone Encounter (Signed)
Phone call to inform patient that [facility] CT 1 is currently down and that [employer] reps have been dispatched to assess the issue.   Offered apologies and that we will hopefully r/s asap.  Pt stated I notified him just in time as he had not yet taken metoprolol (requested he hold onto that for next appt)  All questions answered.  Marchia Bond RN Navigator Cardiac Imaging [facility] Heart and Vascular Services [phone] Office  [phone] Cell

## 2019-04-23 ENCOUNTER — Other Ambulatory Visit: Payer: Self-pay

## 2019-04-23 ENCOUNTER — Ambulatory Visit (HOSPITAL_COMMUNITY)
Admission: RE | Admit: 2019-04-23 | Discharge: 2019-04-23 | Disposition: A | Discharge status: Home / Self Care | Payer: BC Managed Care – PPO | Source: Ambulatory Visit | Attending: Cardiology | Admitting: Cardiology

## 2019-04-23 DIAGNOSIS — R079 Chest pain, unspecified: Secondary | ICD-10-CM | POA: Insufficient documentation

## 2019-04-23 MED ORDER — METOPROLOL TARTRATE 5 MG/5ML IV SOLN
5.0000 mg | INTRAVENOUS | Status: DC | PRN
  Administered 2019-04-23 (×3): 5 mg via INTRAVENOUS

## 2019-04-23 MED ORDER — NITROGLYCERIN 0.4 MG SL SUBL
SUBLINGUAL_TABLET | SUBLINGUAL | Status: AC
  Filled 2019-04-23: qty 2

## 2019-04-23 MED ORDER — IOHEXOL 350 MG/ML SOLN
100.0000 mL | Freq: Once | INTRAVENOUS | Status: AC | PRN
  Administered 2019-04-23: 100 mL via INTRAVENOUS

## 2019-04-23 MED ORDER — NITROGLYCERIN 0.4 MG SL SUBL
0.8000 mg | SUBLINGUAL_TABLET | Freq: Once | SUBLINGUAL | Status: AC
  Administered 2019-04-23: 0.8 mg via SUBLINGUAL

## 2019-04-23 MED ORDER — METOPROLOL TARTRATE 5 MG/5ML IV SOLN
INTRAVENOUS | Status: AC
  Filled 2019-04-23: qty 20

## 2019-04-23 NOTE — Progress Notes (Signed)
CT scan completed. Tolerated well. D/C home walking, awake and alert. In no distress. 

## 2019-04-28 ENCOUNTER — Telehealth: Payer: Self-pay

## 2019-04-28 DIAGNOSIS — Q210 Ventricular septal defect: Secondary | ICD-10-CM

## 2019-04-28 NOTE — Telephone Encounter (Signed)
The patient has been notified of the result and verbalized understanding.  All questions (if any) were answered. Patient was advised on recommendation for echocardiogram, however he would like to know how much it is going to cost him before he proceeds further. I advised him to call our office and ask to speak with the billing department who will advise further. He will call back if he wished to proceed with the echo.

## 2019-04-28 NOTE — Telephone Encounter (Signed)
-----   Message from Sueanne Margarita, MD sent at 04/28/2019  2:29 PM EST ----- No evidence of CAD.  There is a small VSD  - patient has a hx of VSD in the past.  Please get an echo to evaluate further.  THere is also calcifications of the Pulmonic valve which can be assessed with echo as well

## 2019-05-07 ENCOUNTER — Other Ambulatory Visit (HOSPITAL_COMMUNITY): Payer: BC Managed Care – PPO

## 2019-05-10 ENCOUNTER — Other Ambulatory Visit: Payer: Self-pay

## 2019-05-10 ENCOUNTER — Ambulatory Visit (HOSPITAL_COMMUNITY): Payer: BC Managed Care – PPO | Attending: Cardiology

## 2019-05-10 DIAGNOSIS — Q210 Ventricular septal defect: Secondary | ICD-10-CM | POA: Diagnosis not present

## 2019-11-15 ENCOUNTER — Other Ambulatory Visit: Payer: Self-pay | Admitting: Cardiovascular Disease

## 2019-11-15 NOTE — Telephone Encounter (Signed)
Refill request

## 2020-02-29 ENCOUNTER — Ambulatory Visit: Payer: BC Managed Care – PPO | Admitting: Cardiology

## 2020-03-06 ENCOUNTER — Ambulatory Visit: Payer: BC Managed Care – PPO | Admitting: Cardiology

## 2020-03-06 ENCOUNTER — Other Ambulatory Visit: Payer: Self-pay

## 2020-03-06 ENCOUNTER — Encounter: Payer: Self-pay | Admitting: Cardiology

## 2020-03-06 VITALS — BP 124/80 | HR 80 | Ht 68.0 in | Wt 223.0 lb

## 2020-03-06 DIAGNOSIS — E669 Obesity, unspecified: Secondary | ICD-10-CM | POA: Diagnosis not present

## 2020-03-06 DIAGNOSIS — G4733 Obstructive sleep apnea (adult) (pediatric): Secondary | ICD-10-CM | POA: Diagnosis not present

## 2020-03-06 DIAGNOSIS — R079 Chest pain, unspecified: Secondary | ICD-10-CM | POA: Diagnosis not present

## 2020-03-06 DIAGNOSIS — Q210 Ventricular septal defect: Secondary | ICD-10-CM | POA: Diagnosis not present

## 2020-03-06 NOTE — Patient Instructions (Signed)

## 2020-03-06 NOTE — Progress Notes (Addendum)
Date:  03/06/2020   ID:  Jackson Blackburn, DOB 10/02/1970, MRN [id number]  PCP:  Darrow Bussing, MD  Sleep Medicine:  Armanda Magic, MD Electrophysiologist:  None   Chief Complaint:  OSA  History of Present Illness:    Jackson Blackburn is a 49 y.o. male with a hx of OSA on BIPAP therapy and VSD s/p repair as a child. When I last saw him he complained of CP and a coronary CTA showed a Coronary Ca score of 0 and no CAD.  There was a small residual VSD with L>R shunting.  LVF was low normal.  He has a hx of occluded SFA felt related to prior surgery for VSD with no evidence of PAD and is on cilostazol.    He is here today for followup and is doing well.  He denies any chest pain or pressure, SOB, DOE, PND, orthopnea, LE edema, dizziness, palpitations or syncope. He is compliant with his meds and is tolerating meds with no SE.    He is doing well with his CPAP device and thinks that He has gotten used to it.  He tolerates the mask and feels the pressure is adequate.  Since going on CPAP he feels rested in the am and has no significant daytime sleepiness.  He denies any significant mouth or nasal dryness or nasal congestion.  He does not think that he snores.     Prior CV studies:   The following studies were reviewed today:  PAP compliance download, EKG  Past Medical History:  Diagnosis Date  . OSA (obstructive sleep apnea)    Severe  . VSD (ventricular septal defect)    Congenital   Past Surgical History:  Procedure Laterality Date  . no surgical hx    . PERIPHERAL VASCULAR CATHETERIZATION N/A 02/01/2015   Procedure: Abdominal Aortogram;  Surgeon: Iran Ouch, MD;  Location: [facility];  Service: Cardiovascular;  Laterality: N/A;     Current Meds  Medication Sig  . acetaminophen (TYLENOL) 325 MG tablet Take 650 mg by mouth every 6 (six) hours as needed (For pain).  Marland Kitchen aspirin EC 81 MG tablet Take 81 mg by mouth daily.  . cilostazol (PLETAL) 50 MG tablet TAKE 1  TABLET(50 MG) BY MOUTH TWICE DAILY  . Multiple Vitamin (MULTIVITAMIN) tablet Take 1 tablet by mouth daily.     Allergies:   Penicillins   Social History   Tobacco Use  . Smoking status: Never Smoker  . Smokeless tobacco: Never Used  Vaping Use  . Vaping Use: Never used  Substance Use Topics  . Alcohol use: Yes    Comment: rare  . Drug use: No     Family Hx: The patient's family history includes CVA in his father; Coronary artery disease in his father and paternal uncle; Heart disease in his father and paternal uncle; Hypertension in his father.  ROS:   Please see the history of present illness.     All other systems reviewed and are negative.   Labs/Other Tests and Data Reviewed:    Recent Labs: No results found for requested labs within last 8760 hours.   Recent Lipid Panel No results found for: CHOL, TRIG, HDL, CHOLHDL, LDLCALC, LDLDIRECT  Wt Readings from Last 3 Encounters:  03/06/20 223 lb (101.2 kg)  04/20/19 220 lb (99.8 kg)  02/23/19 212 lb (96.2 kg)     Objective:    Vital Signs:  BP 124/80   Pulse 80  Ht 5\' 8"  (1.727 m)   Wt 223 lb (101.2 kg)   SpO2 95%   BMI 33.91 kg/m    GEN: Well nourished, well developed in no acute distress HEENT: Normal NECK: No JVD; No carotid bruits LYMPHATICS: No lymphadenopathy CARDIAC:RRR, no murmurs, rubs, gallops RESPIRATORY:  Clear to auscultation without rales, wheezing or rhonchi  ABDOMEN: Soft, non-tender, non-distended MUSCULOSKELETAL:  No edema; No deformity  SKIN: Warm and dry NEUROLOGIC:  Alert and oriented x 3 PSYCHIATRIC:  Normal affect   EKG was performed in the office today and showed NSR with LAFB and IRBBB. No change from 2020  ASSESSMENT & PLAN:    1. OSA -  The patient is tolerating PAP therapy well without any problems. The PAP download was reviewed today and showed an AHI of 1.6/hr on 12/8 cm H2O with 100% compliance in using more than 4 hours nightly.  The patient has been using and  benefiting from PAP use and will continue to benefit from therapy.   2.  VSD -s/p repair as a child -2D echo 04/2019 showed low normal LVF with EF 50-55%  -coronary CTA showed a small VSD with L>R shunt  3.  Obesity -I have encouraged him to get into a routine exercise program and cut back on carbs and portions.   4.  Chest pain -Coronary CTA 04/2019 showed a coronary Ca score of 0 and no CAD  Medication Adjustments/Labs and Tests Ordered: Current medicines are reviewed at length with the patient today.  Concerns regarding medicines are outlined above.  Tests Ordered: Orders Placed This Encounter  Procedures  . EKG 12-Lead   Medication Changes: No orders of the defined types were placed in this encounter.   Disposition:  Follow up in 1 year(s)  Signed, 05/2019, MD  03/06/2020 3:11 PM    [facility]

## 2020-04-25 ENCOUNTER — Ambulatory Visit: Payer: BC Managed Care – PPO | Admitting: Cardiovascular Disease

## 2020-04-25 ENCOUNTER — Other Ambulatory Visit: Payer: Self-pay

## 2020-04-25 ENCOUNTER — Encounter: Payer: Self-pay | Admitting: Cardiovascular Disease

## 2020-04-25 VITALS — BP 128/79 | HR 84 | Ht 66.0 in | Wt 224.6 lb

## 2020-04-25 DIAGNOSIS — I739 Peripheral vascular disease, unspecified: Secondary | ICD-10-CM

## 2020-04-25 DIAGNOSIS — G4733 Obstructive sleep apnea (adult) (pediatric): Secondary | ICD-10-CM | POA: Diagnosis not present

## 2020-04-25 MED ORDER — CILOSTAZOL 50 MG PO TABS: ORAL_TABLET | ORAL | 11 refills | Status: DC

## 2020-04-25 NOTE — Progress Notes (Signed)
Cardiology Office Note   Date:  04/25/2020   ID:  OSSIEL MARCHIO, DOB 1971-03-15, MRN [id number]  PCP:  Darrow Bussing, MD  Cardiologist: Dr. Louie Boston, MD   No chief complaint on file.     History of Present Illness: Jackson Blackburn is a 49 y.o. male who presents for a follow-up visit regarding  claudication. He has known history of VSD repair in 1974. He was 49 years old at that time and required catheterization via the right femoral artery. He has a scar in that area. Other medical problems include obesity and obstructive sleep apnea.  He was seen for right calf claudication in 2016. He underwent noninvasive evaluation which showed normal ABI bilaterally. 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 Angiography in 01/2015 showed flush occlusion of the right SFA with reconstitution in the midsegment. Extensive collaterals from the profunda. There was overall no significant atherosclerosis noted.  The SFA occlusion was felt to be traumatic from previous surgery and catheterization was 49 years old.   He continues to work at [employer].  He has minimal right calf claudication but his symptoms become obvious if he forgets to take cilostazol.  He had cardiac CTA in December 2020 which showed coronary calcium score of 0 no evidence of obstructive disease.  Past Medical History:  Diagnosis Date  . OSA (obstructive sleep apnea)    Severe  . VSD (ventricular septal defect)    Congenital    Past Surgical History:  Procedure Laterality Date  . no surgical hx    . PERIPHERAL VASCULAR CATHETERIZATION N/A 02/01/2015   Procedure: Abdominal Aortogram;  Surgeon: Iran Ouch, MD;  Location: [facility];  Service: Cardiovascular;  Laterality: N/A;     Current Outpatient Medications  Medication Sig Dispense Refill  . acetaminophen (TYLENOL) 325 MG tablet Take 650 mg by mouth every 6 (six) hours as needed (For pain).    Marland Kitchen aspirin EC 81  MG tablet Take 81 mg by mouth daily.    . cilostazol (PLETAL) 50 MG tablet TAKE 1 TABLET(50 MG) BY MOUTH TWICE DAILY 60 tablet 2  . Multiple Vitamin (MULTIVITAMIN) tablet Take 1 tablet by mouth daily.     No current facility-administered medications for this visit.    Allergies:   Penicillins    Social History:  The patient  reports that he has never smoked. He has never used smokeless tobacco. He reports current alcohol use. He reports that he does not use drugs.   Family History:  The patient's family history includes CVA in his father; Coronary artery disease in his father and paternal uncle; Heart disease in his father and paternal uncle; Hypertension in his father.    ROS:  Please see the history of present illness.   Otherwise, review of systems are positive for none.   All other systems are reviewed and negative.    PHYSICAL EXAM: VS:  BP 128/79   Pulse 84   Ht 5\' 6"  (1.676 m)   Wt 224 lb 9.6 oz (101.9 kg)   SpO2 97%   BMI 36.25 kg/m  , BMI Body mass index is 36.25 kg/m. GEN: Well nourished, well developed, in no acute distress  HEENT: normal  Neck: no JVD, carotid bruits, or masses Cardiac: RRR; no murmurs, rubs, or gallops,no edema  Respiratory:  clear to auscultation bilaterally, normal work of breathing GI: soft, nontender, nondistended, + BS MS: no deformity or atrophy  Skin:  warm and dry, no rash Neuro:  Strength and sensation are intact Psych: euthymic mood, full affect   EKG:  EKG is not ordered today.    Recent Labs: No results found for requested labs within last 8760 hours.    Lipid Panel No results found for: CHOL, TRIG, HDL, CHOLHDL, VLDL, LDLCALC, LDLDIRECT    Wt Readings from Last 3 Encounters:  04/25/20 224 lb 9.6 oz (101.9 kg)  03/06/20 223 lb (101.2 kg)  04/20/19 220 lb (99.8 kg)       No flowsheet data found.    ASSESSMENT AND PLAN:  1.  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  There was no evidence of atherosclerosis.  Continue small dose cilostazol. Symptoms are overall mild and not lifestyle limiting.    2. Obstructive sleep apnea: On CPAP.     Disposition:   FU with me in 1 year  Signed,  Lorine Bears, MD  04/25/2020 8:16 AM    [facility]

## 2020-04-25 NOTE — Patient Instructions (Signed)

## 2020-04-27 IMAGING — CT CT HEART MORP W/ CTA COR W/ SCORE W/ CA W/CM &/OR W/O CM
4 of 7 series · 8 of 20 positions shown, 9 images · IV contrast (APPLIED)
Comparison: None.
COMPARISON: None.

Addendum:
EXAM:
OVER-READ INTERPRETATION  CT CHEST

The following report is an over-read performed by radiologist Dr.
Endre Szotyi Meleg [REDACTED] on 04/24/2019. This
over-read does not include interpretation of cardiac or coronary
anatomy or pathology. The coronary calcium score/coronary CTA
interpretation by the cardiologist is attached.
TECHNIQUE: The patient was scanned on a Phillips Force scanner.

[Series 6: best diast 67 % · axial · 0.39mm/px · z∈[-122,-74]mm · 2 of 361 slices shown, 3 images]
[im 121/361  vessel]
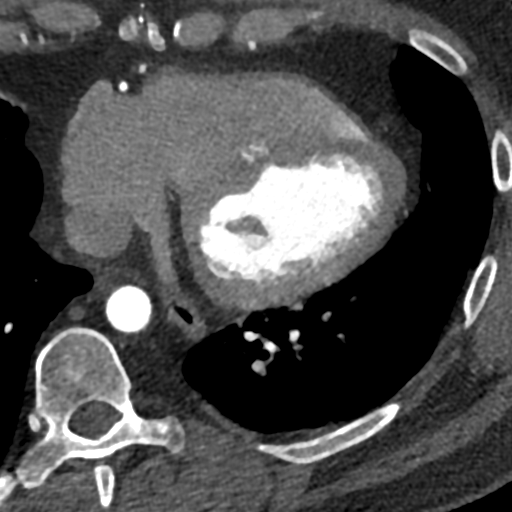
[im 121/361  lung]
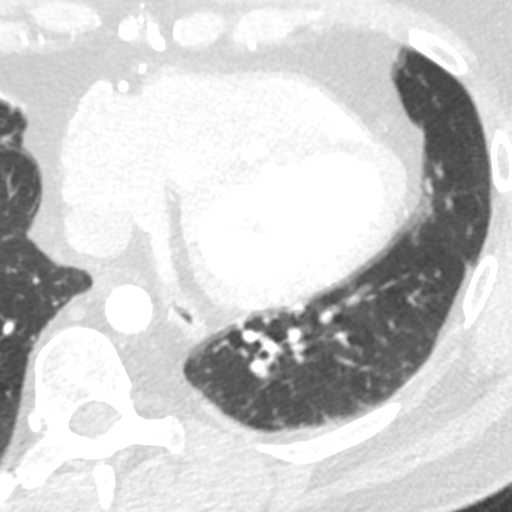
[im 241/361  vessel]
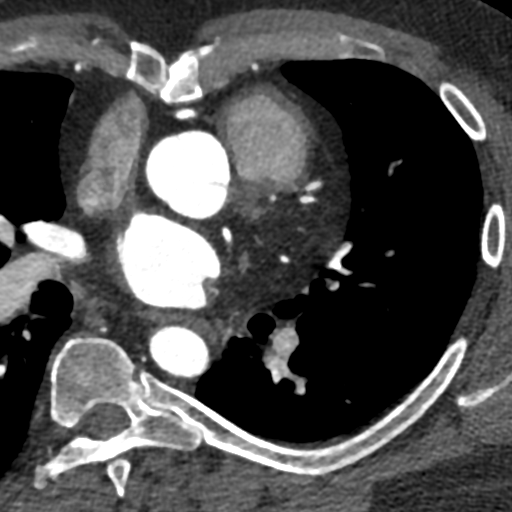

[Series 7: best syst 32 % · axial · 0.39mm/px · z∈[-122,-74]mm · 2 of 361 slices shown]
[im 121/361  vessel]
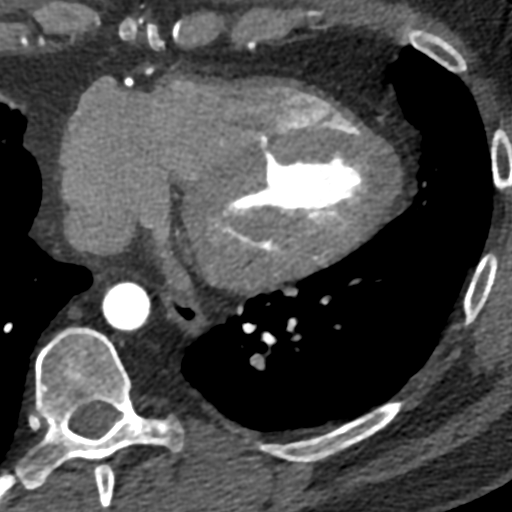
[im 241/361  vessel]
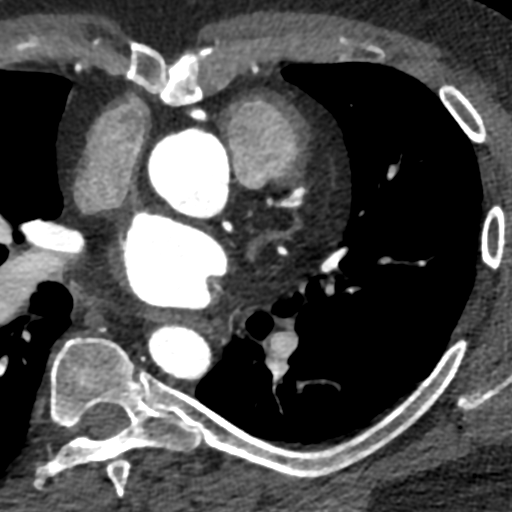

[Series 8: ts diast sharp 32 % · axial · 0.39mm/px · z∈[-122,-74]mm · 2 of 361 slices shown]
[im 121/361  lung]
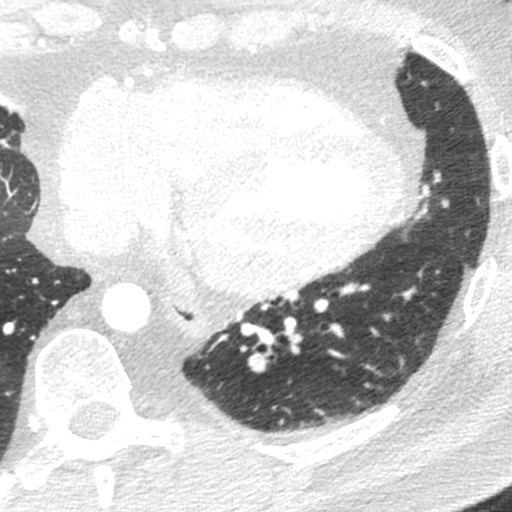
[im 241/361  lung]
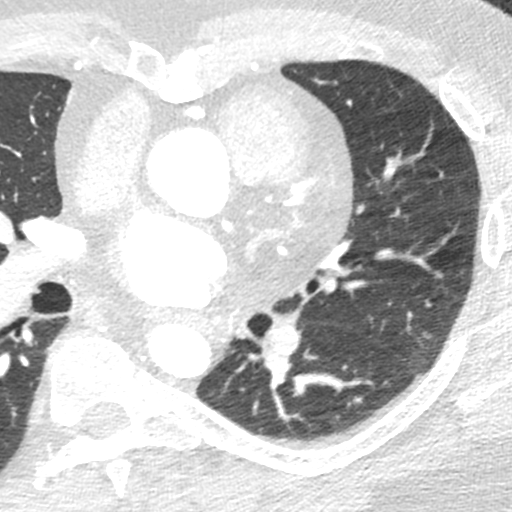

[Series 9: ts syst sharp 32 % · axial · 0.39mm/px · z∈[-122,-74]mm · 2 of 361 slices shown]
[im 121/361  lung]
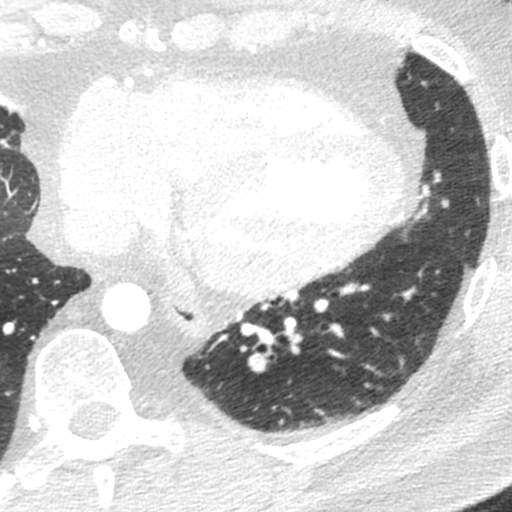
[im 241/361  lung]
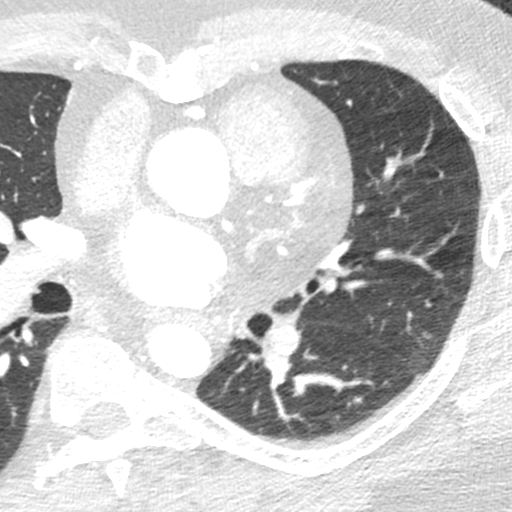

[8 of 20 positions shown; findings below may reference images not displayed]

FINDINGS: Limited view of the lung parenchyma demonstrates no suspicious
nodularity. Airways are normal.

Limited view of the mediastinum demonstrates no adenopathy.
Esophagus normal.

Limited view of the upper abdomen unremarkable.

Limited view of the skeleton and chest wall is unremarkable.
IMPRESSION: No significant extracardiac findings.

EXAM:
Cardiac/Coronary  CT
FINDINGS: A 120 kV prospective scan was triggered in the descending thoracic
aorta at 111 HU's. Axial non-contrast 3 mm slices were carried out
through the heart. The data set was analyzed on a dedicated work
station and scored using the Agatson method. Gantry rotation speed
was 250 msecs and collimation was .6 mm. No beta blockade and 0.8 mg
of sl NTG was given. The 3D data set was reconstructed in 5%
intervals of the 67-82 % of the R-R cycle. Diastolic phases were
analyzed on a dedicated work station using MPR, MIP and VRT modes.
The patient received 80 cc of contrast.

Aorta:  Normal size.  No calcifications.  No dissection.

Aortic Valve:  Trileaflet.  No calcifications.

Coronary Arteries:  Normal coronary origin.  Right dominance.

RCA is a large dominant artery that gives rise to PDA and PLVB.
There is no plaque.

Left main is a large artery that gives rise to LAD, Ramus and LCX
arteries. There is no plaque.

LAD is a large vessel that gives rise to a moderate sized diagonal.
There is no plaque.

LCX is a non-dominant artery that gives rise to one large OM1
branch. There is no plaque.

Other findings:

Normal pulmonary vein drainage into the left atrium.

Normal let atrial appendage without a thrombus.

Normal size of the pulmonary artery.
IMPRESSION: 1. Coronary calcium score of 0. This was 0 percentile for age and
sex matched control.

2. Normal coronary origin with right dominance.

3. No evidence of CAD.  CAD-RADS 0.

4.  Consider non cardiac causes of chest pain.

5. There is a small muscular VSD present with left to right
shunting.

5. Pulmonic valve calcifications. Consider 2D echo for further
evaluation of pulmonic valve.

Eduaraper Guzman Valero

*** End of Addendum ***
EXAM:
OVER-READ INTERPRETATION  CT CHEST

The following report is an over-read performed by radiologist Dr.
Endre Szotyi Meleg [REDACTED] on 04/24/2019. This
over-read does not include interpretation of cardiac or coronary
anatomy or pathology. The coronary calcium score/coronary CTA
interpretation by the cardiologist is attached.
FINDINGS: Limited view of the lung parenchyma demonstrates no suspicious
nodularity. Airways are normal.

Limited view of the mediastinum demonstrates no adenopathy.
Esophagus normal.

Limited view of the upper abdomen unremarkable.

Limited view of the skeleton and chest wall is unremarkable.
IMPRESSION: No significant extracardiac findings.

## 2020-12-25 ENCOUNTER — Other Ambulatory Visit: Payer: Self-pay | Admitting: *Deleted

## 2020-12-25 MED ORDER — AMOXICILLIN 500 MG PO CAPS: 2000.0000 mg | ORAL_CAPSULE | ORAL | 2 refills | Status: DC

## 2021-03-21 ENCOUNTER — Ambulatory Visit: Payer: BC Managed Care – PPO | Admitting: Cardiology

## 2021-03-21 ENCOUNTER — Encounter: Payer: Self-pay | Admitting: Cardiology

## 2021-03-21 ENCOUNTER — Other Ambulatory Visit: Payer: Self-pay

## 2021-03-21 VITALS — BP 126/80 | HR 90 | Ht 68.0 in | Wt 222.6 lb

## 2021-03-21 DIAGNOSIS — Q210 Ventricular septal defect: Secondary | ICD-10-CM | POA: Diagnosis not present

## 2021-03-21 DIAGNOSIS — G4733 Obstructive sleep apnea (adult) (pediatric): Secondary | ICD-10-CM

## 2021-03-21 DIAGNOSIS — E669 Obesity, unspecified: Secondary | ICD-10-CM | POA: Diagnosis not present

## 2021-03-21 DIAGNOSIS — R079 Chest pain, unspecified: Secondary | ICD-10-CM | POA: Diagnosis not present

## 2021-03-21 NOTE — Patient Instructions (Signed)

## 2021-03-21 NOTE — Addendum Note (Signed)
Addended by: Theresia Majors on: 03/21/2021 03:09 PM   Modules accepted: Orders

## 2021-03-21 NOTE — Progress Notes (Signed)
Date:  03/21/2021   ID:  [name], DOB [birth year], MRN [id number]  PCP:  Darrow Bussing, MD  Sleep Medicine:  Armanda Magic, MD Electrophysiologist:  None   Chief Complaint:  OSA  History of Present Illness:    Jackson Blackburn is a 50 y.o. male with a hx of OSA on BIPAP therapy and VSD s/p repair as a child. When I last saw him he complained of CP and a coronary CTA showed a Coronary Ca score of 0 and no CAD.  There was a small residual VSD with L>R shunting.  LVF was low normal.  He has a hx of occluded SFA felt related to prior surgery for VSD with no evidence of PAD and is on cilostazol.    He is here today for followup and is doing well.  He occasionally has some mild chest discomfort usually when he is stressing about issues at work.  He denies any SOB, DOE, PND, orthopnea, LE edema, dizziness, palpitations or syncope. He is compliant with his meds and is tolerating meds with no SE.     He is doing well with his CPAP device and thinks that he has gotten used to it.  He tolerates the nasal pillow mask and feels the pressure is adequate.  Since going on CPAP he feels rested in the am and has no significant daytime sleepiness.  He denies any significant mouth or nasal dryness or nasal congestion.  He does not think that he snores.     Prior CV studies:   The following studies were reviewed today:  PAP compliance download, EKG  Past Medical History:  Diagnosis Date   OSA (obstructive sleep apnea)    Severe   VSD (ventricular septal defect)    Congenital   Past Surgical History:  Procedure Laterality Date   no surgical hx     PERIPHERAL VASCULAR CATHETERIZATION N/A 02/01/2015   Procedure: Abdominal Aortogram;  Surgeon: Iran Ouch, MD;  Location: [facility];  Service: Cardiovascular;  Laterality: N/A;     Current Meds  Medication Sig   acetaminophen (TYLENOL) 325 MG tablet Take 650 mg by mouth every 6 (six) hours as needed (For pain).   amoxicillin (AMOXIL)  500 MG capsule Take 4 capsules (2,000 mg total) by mouth as directed.   aspirin EC 81 MG tablet Take 81 mg by mouth as needed. Swallow whole.   cilostazol (PLETAL) 50 MG tablet TAKE 1 TABLET(50 MG) BY MOUTH TWICE DAILY   Multiple Vitamin (MULTIVITAMIN) tablet Take 1 tablet by mouth daily.     Allergies:   Penicillins   Social History   Tobacco Use   Smoking status: Never   Smokeless tobacco: Never  Vaping Use   Vaping Use: Never used  Substance Use Topics   Alcohol use: Yes    Comment: rare   Drug use: No     Family Hx: The patient's family history includes CVA in his father; Coronary artery disease in his father and paternal uncle; Heart disease in his father and paternal uncle; Hypertension in his father.  ROS:   Please see the history of present illness.     All other systems reviewed and are negative.   Labs/Other Tests and Data Reviewed:    Recent Labs: No results found for requested labs within last 8760 hours.   Recent Lipid Panel No results found for: CHOL, TRIG, HDL, CHOLHDL, LDLCALC, LDLDIRECT  Wt Readings from Last 3 Encounters:  03/21/21 222  lb 9.6 oz (101 kg)  04/25/20 224 lb 9.6 oz (101.9 kg)  03/06/20 223 lb (101.2 kg)     Objective:    Vital Signs:  BP 126/80   Pulse 90   Ht 5\' 8"  (1.727 m)   Wt 222 lb 9.6 oz (101 kg)   SpO2 97%   BMI 33.85 kg/m    GEN: Well nourished, well developed in no acute distress HEENT: Normal NECK: No JVD; No carotid bruits LYMPHATICS: No lymphadenopathy CARDIAC:RRR, no murmurs, rubs, gallops RESPIRATORY:  Clear to auscultation without rales, wheezing or rhonchi  ABDOMEN: Soft, non-tender, non-distended MUSCULOSKELETAL:  No edema; No deformity  SKIN: Warm and dry NEUROLOGIC:  Alert and oriented x 3 PSYCHIATRIC:  Normal affect    EKG was performed in the office today and showed NSR with iRBBB  ASSESSMENT & PLAN:    1. OSA - The patient is tolerating PAP therapy well without any problems. The PAP download  performed by his DME was personally reviewed and interpreted by me today and showed an AHI of 1.8/hr on 12/8 cm H2O with 100% compliance in using more than 4 hours nightly.  The patient has been using and benefiting from PAP use and will continue to benefit from therapy.    2.  VSD -s/p repair as a child -2D echo 04/2019 showed low normal LVF with EF 50-55%  -coronary CTA showed a small VSD with L>R shunt -repeat 2D echo to make sure there is no right sided enlargement  3.  Obesity -I have encouraged him to get into a routine exercise program and cut back on carbs and portions.   4.  Chest pain -non cardiac and likely related to stress -Coronary CTA 04/2019 showed a coronary Ca score of 0 and no CAD  5.  PVD -vascular angiography 2016 showed Flush occlusion of the right SFA with reconstitution in the midsegment. Collaterals from the profunda. The origin of the SFA was not clear on angiography. Otherwise there was no evidence of atherosclerosis in the rest of the arteries are completely normal -continue medical management with Pletal 50mg  BID and ASA with PRN refills -followed Dr. 2017  Medication Adjustments/Labs and Tests Ordered: Current medicines are reviewed at length with the patient today.  Concerns regarding medicines are outlined above.  Tests Ordered: Orders Placed This Encounter  Procedures   EKG 12-Lead    Medication Changes: No orders of the defined types were placed in this encounter.   Disposition:  Follow up in 1 year(s)  Signed, , MD  03/21/2021 3:02 PM    [facility]

## 2021-04-10 ENCOUNTER — Ambulatory Visit (HOSPITAL_COMMUNITY): Payer: BC Managed Care – PPO | Attending: Internal Medicine

## 2021-04-10 DIAGNOSIS — Q210 Ventricular septal defect: Secondary | ICD-10-CM | POA: Diagnosis not present

## 2021-04-10 LAB — ECHOCARDIOGRAM COMPLETE
Area-P 1/2: 3.58 cm2
S' Lateral: 2.9 cm

## 2021-04-10 MED ORDER — PERFLUTREN LIPID MICROSPHERE
2.0000 mL | INTRAVENOUS | Status: AC | PRN
  Administered 2021-04-10: 2 mL via INTRAVENOUS

## 2021-04-24 ENCOUNTER — Encounter: Payer: Self-pay | Admitting: Cardiovascular Disease

## 2021-06-11 ENCOUNTER — Other Ambulatory Visit: Payer: Self-pay | Admitting: Cardiovascular Disease

## 2021-06-12 NOTE — Telephone Encounter (Signed)
Refill request

## 2021-06-19 ENCOUNTER — Encounter: Payer: Self-pay | Admitting: Cardiovascular Disease

## 2021-06-19 ENCOUNTER — Ambulatory Visit: Payer: BC Managed Care – PPO | Admitting: Cardiovascular Disease

## 2021-06-19 ENCOUNTER — Other Ambulatory Visit: Payer: Self-pay

## 2021-06-19 VITALS — BP 120/68 | HR 76 | Ht 66.0 in | Wt 222.6 lb

## 2021-06-19 DIAGNOSIS — I739 Peripheral vascular disease, unspecified: Secondary | ICD-10-CM

## 2021-06-19 DIAGNOSIS — G4733 Obstructive sleep apnea (adult) (pediatric): Secondary | ICD-10-CM

## 2021-06-19 NOTE — Progress Notes (Signed)
Cardiology Office Note   Date:  06/19/2021   ID:  Jackson Blackburn, DOB Oct 24, 1970, MRN [id number]  PCP:  Lujean Amel, MD  Cardiologist: Dr. Holli Humbles, MD   No chief complaint on file.     History of Present Illness: Jackson Blackburn is a 51 y.o. male who presents for a follow-up visit regarding  claudication. He has known history of VSD repair in 1974. He was 51 years old at that time and required catheterization via the right femoral artery. He has a scar in that area. Other medical problems include obesity and obstructive sleep apnea.  He was seen for right calf claudication in 2016. He underwent noninvasive evaluation which showed normal ABI bilaterally. 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 Angiography in 01/2015 showed flush occlusion of the right SFA with reconstitution in the midsegment. Extensive collaterals from the profunda. There was overall no significant atherosclerosis noted.  The SFA occlusion was felt to be traumatic from previous surgery and catheterizations when he was 51 years old.   He continues to work at [employer].  He has minimal right calf claudication but his symptoms become obvious if he forgets to take cilostazol.  Cardiac CTA in December 2020 showed coronary calcium score of 0 no evidence of obstructive disease.  He denies chest pain or shortness of breath.  Past Medical History:  Diagnosis Date   OSA (obstructive sleep apnea)    Severe   VSD (ventricular septal defect)    Congenital    Past Surgical History:  Procedure Laterality Date   no surgical hx     PERIPHERAL VASCULAR CATHETERIZATION N/A 02/01/2015   Procedure: Abdominal Aortogram;  Surgeon: Wellington Hampshire, MD;  Location: [facility];  Service: Cardiovascular;  Laterality: N/A;     Current Outpatient Medications  Medication Sig Dispense Refill   acetaminophen (TYLENOL) 325 MG tablet Take 650 mg by mouth every 6 (six) hours as  needed (For pain).     cilostazol (PLETAL) 50 MG tablet TAKE 1 TABLET(50 MG) BY MOUTH TWICE DAILY 60 tablet 11   Multiple Vitamin (MULTIVITAMIN) tablet Take 1 tablet by mouth daily.     amoxicillin (AMOXIL) 500 MG capsule Take 4 capsules (2,000 mg total) by mouth as directed. (Patient not taking: Reported on 06/19/2021) 4 capsule 2   aspirin EC 81 MG tablet Take 81 mg by mouth as needed. Swallow whole. (Patient not taking: Reported on 06/19/2021)     No current facility-administered medications for this visit.    Allergies:   Penicillins    Social History:  The patient  reports that he has never smoked. He has never used smokeless tobacco. He reports current alcohol use. He reports that he does not use drugs.   Family History:  The patient's family history includes CVA in his father; Coronary artery disease in his father and paternal uncle; Heart disease in his father and paternal uncle; Hypertension in his father.    ROS:  Please see the history of present illness.   Otherwise, review of systems are positive for none.   All other systems are reviewed and negative.    PHYSICAL EXAM: VS:  BP 120/68    Pulse 76    Ht 5\' 6"  (1.676 m)    Wt 222 lb 9.6 oz (101 kg)    SpO2 96%    BMI 35.93 kg/m  , BMI Body mass index is 35.93 kg/m. GEN: Well nourished, well  developed, in no acute distress  HEENT: normal  Neck: no JVD, carotid bruits, or masses Cardiac: RRR; no murmurs, rubs, or gallops,no edema  Respiratory:  clear to auscultation bilaterally, normal work of breathing GI: soft, nontender, nondistended, + BS MS: no deformity or atrophy  Skin: warm and dry, no rash Neuro:  Strength and sensation are intact Psych: euthymic mood, full affect   EKG:  EKG is not ordered today.    Recent Labs: No results found for requested labs within last 8760 hours.    Lipid Panel No results found for: CHOL, TRIG, HDL, CHOLHDL, VLDL, LDLCALC, LDLDIRECT    Wt Readings from Last 3 Encounters:   06/19/21 222 lb 9.6 oz (101 kg)  03/21/21 222 lb 9.6 oz (101 kg)  04/25/20 224 lb 9.6 oz (101.9 kg)       No flowsheet data found.    ASSESSMENT AND PLAN:  1.  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  There was no evidence of atherosclerosis.  I refilled cilostazol. Symptoms are overall mild and not lifestyle limiting.    2. Obstructive sleep apnea: On CPAP.     Disposition:   FU with me in 1 year  Signed,  Kathlyn Sacramento, MD  06/19/2021 8:50 AM    [place]

## 2021-06-19 NOTE — Patient Instructions (Signed)

## 2022-01-13 ENCOUNTER — Other Ambulatory Visit: Payer: Self-pay | Admitting: Internal Medicine

## 2022-05-07 ENCOUNTER — Encounter: Payer: Self-pay | Admitting: Cardiology

## 2022-05-07 ENCOUNTER — Telehealth: Payer: Self-pay | Admitting: *Deleted

## 2022-05-07 ENCOUNTER — Ambulatory Visit: Payer: BC Managed Care – PPO | Attending: Cardiology | Admitting: Cardiology

## 2022-05-07 VITALS — BP 124/74 | HR 74 | Ht 66.0 in | Wt 225.0 lb

## 2022-05-07 DIAGNOSIS — Q210 Ventricular septal defect: Secondary | ICD-10-CM | POA: Diagnosis not present

## 2022-05-07 DIAGNOSIS — I739 Peripheral vascular disease, unspecified: Secondary | ICD-10-CM | POA: Diagnosis not present

## 2022-05-07 DIAGNOSIS — G4733 Obstructive sleep apnea (adult) (pediatric): Secondary | ICD-10-CM

## 2022-05-07 DIAGNOSIS — R079 Chest pain, unspecified: Secondary | ICD-10-CM

## 2022-05-07 LAB — LIPID PANEL
Chol/HDL Ratio: 4.5 ratio (ref 0.0–5.0)
Cholesterol, Total: 218 mg/dL — ABNORMAL HIGH (ref 100–199)
HDL: 48 mg/dL (ref >39)
LDL Chol Calc (NIH): 152 mg/dL — ABNORMAL HIGH (ref 0–99)
Triglycerides: 103 mg/dL (ref 0–149)
VLDL Cholesterol Cal: 18 mg/dL (ref 5–40)

## 2022-05-07 LAB — ALT: ALT: 24 IU/L (ref 0–44)

## 2022-05-07 NOTE — Progress Notes (Addendum)
Date:  05/07/2022   ID:  Jackson Blackburn, DOB [birth year], MRN [id number]  PCP:  Darrow Bussing, MD  Sleep Medicine:  Armanda Magic, MD Electrophysiologist:  None   Chief Complaint:  OSA  History of Present Illness:    Jackson Blackburn is a 51 y.o. male with a hx of OSA on BIPAP therapy and VSD s/p repair as a child. When I last saw him he complained of CP and a coronary CTA showed a Coronary Ca score of 0 and no CAD.  There was a small residual VSD with L>R shunting.  LVF was low normal.  He has a hx of occluded SFA felt related to prior surgery for VSD with no evidence of PAD and is on cilostazol.    He is here today for followup and is doing well.  He tells me that he will have CP once yearly that is associated with a stressful situation.  The pain will radiate across his chest and down his arms and into his jaw.  If he deep breathes and rests it goes away.  He has had this for years and that is why we initially did the coronary CTA.   He walks several times weekly with no CP then. He denies any SOB, DOE, PND, orthopnea, LE edema, dizziness, palpitations or syncope. He is compliant with his meds and is tolerating meds with no SE.    He is doing well with his PAP device and thinks that he has gotten used to it.  He tolerates the mask and feels the pressure is adequate.  Since going on PAP he feels rested in the am and has no significant daytime sleepiness.  He denies any significant mouth or nasal dryness or nasal congestion.  He does not think that he snores.    Prior CV studies:   The following studies were reviewed today:  PAP compliance download, EKG  Past Medical History:  Diagnosis Date   OSA (obstructive sleep apnea)    Severe   VSD (ventricular septal defect)    Congenital   Past Surgical History:  Procedure Laterality Date   no surgical hx     PERIPHERAL VASCULAR CATHETERIZATION N/A 02/01/2015   Procedure: Abdominal Aortogram;  Surgeon: Iran Ouch, MD;  Location: [facility]  IN[facility]IVE [facility];  Service: Cardiovascular;  Laterality: N/A;     No outpatient medications have been marked as taking for the 05/07/22 encounter (Office Visit) with Quintella Reichert, MD.     Allergies:   Penicillins   Social History   Tobacco Use   Smoking status: Never   Smokeless tobacco: Never  Vaping Use   Vaping Use: Never used  Substance Use Topics   Alcohol use: Yes    Comment: rare   Drug use: No     Family Hx: The patient's family history includes CVA in his father; Coronary artery disease in his father and paternal uncle; Heart disease in his father and paternal uncle; Hypertension in his father.  ROS:   Please see the history of present illness.     All other systems reviewed and are negative.   Labs/Other Tests and Data Reviewed:    Recent Labs: No results found for requested labs within last 365 days.   Recent Lipid Panel No results found for: "CHOL", "TRIG", "HDL", "CHOLHDL", "LDLCALC", "LDLDIRECT"  Wt Readings from Last 3 Encounters:  05/07/22 225 lb (102.1 kg)  06/19/21 222 lb 9.6 oz (101 kg)  03/21/21 222 lb 9.6  oz (101 kg)     Objective:    Vital Signs:  BP 124/74   Pulse 74   Ht 5\' 6"  (1.676 m)   Wt 225 lb (102.1 kg)   SpO2 98%   BMI 36.32 kg/m    GEN: Well nourished, well developed in no acute distress HEENT: Normal NECK: No JVD; No carotid bruits LYMPHATICS: No lymphadenopathy CARDIAC:RRR, no murmurs, rubs, gallops RESPIRATORY:  Clear to auscultation without rales, wheezing or rhonchi  ABDOMEN: Soft, non-tender, non-distended MUSCULOSKELETAL:  No edema; No deformity  SKIN: Warm and dry NEUROLOGIC:  Alert and oriented x 3 PSYCHIATRIC:  Normal affect    EKG was performed in the office today showed NSR with iRBBB  ASSESSMENT & PLAN:    1. OSA - The patient is tolerating PAP therapy well without any problems. The PAP download performed by his DME was personally reviewed and interpreted by me today and showed an AHI of 1.5/hr on  12/8 cm H2O with 100% compliance in using more than 4 hours nightly.  The patient has been using and benefiting from PAP use and will continue to benefit from therapy.    2.  VSD -s/p repair as a child -2D echo 04/2021 showed low normal LVF with EF 50-55% with no residual VSD -coronary CTA showed a small VSD with L>R shunt  3.  PVD -vascular angiography 2016 showed Flush occlusion of the right SFA (damaged during repair of VSD) with reconstitution in the midsegment. Collaterals from the profunda. The origin of the SFA was not clear on angiography. There was no evidence of atherosclerosis in the rest of the arteries are completely normal -continue prescription drug management with Pletal 50mg  BID and ASA 81mg  daily with PRN refills -followed Dr. 2017  4.  Atypical CP -he has had this for years and coronary CTA in 2020 with no Ca and no CAD -I will repeat an ETT -Shared Decision Making/Informed Consent The risks [chest pain, shortness of breath, cardiac arrhythmias, dizziness, blood pressure fluctuations, myocardial infarction, stroke/transient ischemic attack, and life-threatening complications (estimated to be 1 in 10,000)], benefits (risk stratification, diagnosing coronary artery disease, treatment guidance) and alternatives of an exercise tolerance test were discussed in detail with Jackson Blackburn and he agrees to proceed.  5.  HLD -LDL goal < 100 -Check  FLP and ALT   Medication Adjustments/Labs and Tests Ordered: Current medicines are reviewed at length with the patient today.  Concerns regarding medicines are outlined above.  Tests Ordered: Orders Placed This Encounter  Procedures   EKG 12-Lead    Medication Changes: No orders of the defined types were placed in this encounter.    Disposition:  Follow up in 1 year(s)  Signed, Kirke Corin, MD  05/07/2022 8:26 AM    [facility]

## 2022-05-07 NOTE — Patient Instructions (Signed)
Medication Instructions:  Your physician recommends that you continue on your current medications as directed. Please refer to the Current Medication list given to you today.  *If you need a refill on your cardiac medications before your next appointment, please call your pharmacy*   Lab Work: Lab work to be done today--Lipids and ALT If you have labs (blood work) drawn today and your tests are completely normal, you will receive your results only by: MyChart Message (if you have MyChart) OR A paper copy in the mail If you have any lab test that is abnormal or we need to change your treatment, we will call you to review the results.   Testing/Procedures: none   Follow-Up: At [facility], you and your health needs are our priority.  As part of our continuing mission to provide you with exceptional heart care, we have created designated Provider Care Teams.  These Care Teams include your primary Cardiologist (physician) and Advanced Practice Providers (APPs -  Physician Assistants and Nurse Practitioners) who all work together to provide you with the care you need, when you need it.  We recommend signing up for the patient portal called "MyChart".  Sign up information is provided on this After Visit Summary.  MyChart is used to connect with patients for Virtual Visits (Telemedicine).  Patients are able to view lab/test results, encounter notes, upcoming appointments, etc.  Non-urgent messages can be sent to your provider as well.   To learn more about what you can do with MyChart, go to [email or web address].    Your next appointment:   12 month(s)  The format for your next appointment:   In Person  Provider:   Armanda Magic, MD     Other Instructions    Important Information About Sugar

## 2022-05-07 NOTE — Addendum Note (Signed)
Addended by: Armanda Magic R on: 05/07/2022 11:11 AM   Modules accepted: Orders

## 2022-05-07 NOTE — Telephone Encounter (Signed)
Dr Mayford Knife would like patient to have GXT.  I spoke with patient and went over instructions with him.  He is aware he will be called to schedule this test.

## 2022-05-07 NOTE — Addendum Note (Signed)
Addended by: Dossie Arbour on: 05/07/2022 10:23 AM   Modules accepted: Orders

## 2022-05-07 NOTE — Addendum Note (Signed)
Addended by: Dossie Arbour on: 05/07/2022 08:35 AM   Modules accepted: Orders

## 2022-05-09 ENCOUNTER — Telehealth: Payer: Self-pay

## 2022-05-09 DIAGNOSIS — E7800 Pure hypercholesterolemia, unspecified: Secondary | ICD-10-CM

## 2022-05-09 MED ORDER — ATORVASTATIN CALCIUM 10 MG PO TABS: 10.0000 mg | ORAL_TABLET | Freq: Every day | ORAL | 3 refills | Status: DC

## 2022-05-09 NOTE — Telephone Encounter (Signed)
Patient notified of result.  Please refer to phone note from today for complete details.   Arvid Right Donelle Hise, RN 05/09/2022 4:38 PM   Pt will come in for FLP, ALT on 06/20/21.

## 2022-05-09 NOTE — Telephone Encounter (Signed)
-----   Message from Quintella Reichert, MD sent at 05/08/2022 10:35 AM EST ----- LDL too high - start Atorvastatin 10mg  daily and repeat FLp and ALT in 6 weeks

## 2022-05-30 ENCOUNTER — Ambulatory Visit: Payer: BC Managed Care – PPO | Attending: Cardiology

## 2022-05-30 DIAGNOSIS — R079 Chest pain, unspecified: Secondary | ICD-10-CM | POA: Diagnosis not present

## 2022-05-30 LAB — EXERCISE TOLERANCE TEST
Angina Index: 0
Duke Treadmill Score: 9
Estimated workload: 10.1
Exercise duration (min): 9 min
Exercise duration (sec): 0 s
MPHR: 167 {beats}/min
Peak HR: 151 {beats}/min
Percent HR: 90 %
RPE: 17
Rest HR: 90 {beats}/min
ST Depression (mm): 0 mm

## 2022-06-20 ENCOUNTER — Ambulatory Visit: Payer: BC Managed Care – PPO | Attending: Cardiology

## 2022-06-20 DIAGNOSIS — E7800 Pure hypercholesterolemia, unspecified: Secondary | ICD-10-CM

## 2022-06-20 LAB — LIPID PANEL
Chol/HDL Ratio: 3.3 ratio (ref 0.0–5.0)
Cholesterol, Total: 145 mg/dL (ref 100–199)
HDL: 44 mg/dL (ref >39)
LDL Chol Calc (NIH): 86 mg/dL (ref 0–99)
Triglycerides: 77 mg/dL (ref 0–149)
VLDL Cholesterol Cal: 15 mg/dL (ref 5–40)

## 2022-06-20 LAB — ALT: ALT: 21 IU/L (ref 0–44)

## 2022-06-25 ENCOUNTER — Ambulatory Visit: Payer: BC Managed Care – PPO | Attending: Cardiovascular Disease | Admitting: Cardiovascular Disease

## 2022-06-25 ENCOUNTER — Encounter: Payer: Self-pay | Admitting: Cardiovascular Disease

## 2022-06-25 VITALS — BP 122/68 | HR 80 | Ht 66.0 in | Wt 225.0 lb

## 2022-06-25 DIAGNOSIS — G4733 Obstructive sleep apnea (adult) (pediatric): Secondary | ICD-10-CM

## 2022-06-25 DIAGNOSIS — I739 Peripheral vascular disease, unspecified: Secondary | ICD-10-CM

## 2022-06-25 DIAGNOSIS — E785 Hyperlipidemia, unspecified: Secondary | ICD-10-CM | POA: Diagnosis not present

## 2022-06-25 MED ORDER — CILOSTAZOL 50 MG PO TABS: 50.0000 mg | ORAL_TABLET | Freq: Two times a day (BID) | ORAL | 3 refills | Status: DC

## 2022-06-25 NOTE — Progress Notes (Signed)
Cardiology Office Note   Date:  06/25/2022   ID:  Jackson Blackburn, DOB 1970-10-18, MRN [id number]  PCP:  Lujean Amel, MD  Cardiologist: Dr. Holli Humbles, MD   Chief Complaint  Patient presents with   Follow-up    12 months.      History of Present Illness: Jackson Blackburn is a 52 y.o. male who presents for a follow-up visit regarding  claudication. He has known history of VSD repair in 1974. He was 52 years old at that time and required catheterization via the right femoral artery. He has a scar in that area. Other medical problems include obesity and obstructive sleep apnea.  He was seen for right calf claudication in 2016. He underwent noninvasive evaluation which showed normal ABI bilaterally. 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 Angiography in 01/2015 showed flush occlusion of the right SFA with reconstitution in the midsegment. Extensive collaterals from the profunda. There was overall no significant atherosclerosis noted.  The SFA occlusion was felt to be traumatic from previous surgery and catheterizations when he was 52 years old.   He continues to work at [employer].    Cardiac CTA in December 2020 showed coronary calcium score of 0 no evidence of obstructive disease.  He has been doing well and denies any chest pain or shortness of breath.  He has minimal right calf claudication.  He was started on atorvastatin for hyperlipidemia and has been doing very well with the medication.  No side effects.  In addition, he improved his lifestyle and is trying to lose weight.  Past Medical History:  Diagnosis Date   OSA (obstructive sleep apnea)    Severe   VSD (ventricular septal defect)    Congenital    Past Surgical History:  Procedure Laterality Date   no surgical hx     PERIPHERAL VASCULAR CATHETERIZATION N/A 02/01/2015   Procedure: Abdominal Aortogram;  Surgeon: Wellington Hampshire, MD;  Location: [facility];   Service: Cardiovascular;  Laterality: N/A;     Current Outpatient Medications  Medication Sig Dispense Refill   acetaminophen (TYLENOL) 325 MG tablet Take 650 mg by mouth every 6 (six) hours as needed (For pain).     amoxicillin (AMOXIL) 500 MG capsule TAKE 4 CAPSULES BY MOUTH AS DIRECTED 4 capsule 2   aspirin EC 81 MG tablet Take 81 mg by mouth as needed. Swallow whole.     atorvastatin (LIPITOR) 10 MG tablet Take 1 tablet (10 mg total) by mouth daily. 90 tablet 3   cilostazol (PLETAL) 50 MG tablet TAKE 1 TABLET(50 MG) BY MOUTH TWICE DAILY 60 tablet 11   Multiple Vitamin (MULTIVITAMIN) tablet Take 1 tablet by mouth daily.     No current facility-administered medications for this visit.    Allergies:   Penicillins    Social History:  The patient  reports that he has never smoked. He has never used smokeless tobacco. He reports current alcohol use. He reports that he does not use drugs.   Family History:  The patient's family history includes CVA in his father; Coronary artery disease in his father and paternal uncle; Heart disease in his father and paternal uncle; Hypertension in his father.    ROS:  Please see the history of present illness.   Otherwise, review of systems are positive for none.   All other systems are reviewed and negative.    PHYSICAL EXAM: VS:  BP 122/68 (BP Location:  Left Arm, Patient Position: Sitting, Cuff Size: Normal)   Pulse 80   Ht 5\' 6"  (1.676 m)   Wt 225 lb (102.1 kg)   BMI 36.32 kg/m  , BMI Body mass index is 36.32 kg/m. GEN: Well nourished, well developed, in no acute distress  HEENT: normal  Neck: no JVD, carotid bruits, or masses Cardiac: RRR; no murmurs, rubs, or gallops,no edema  Respiratory:  clear to auscultation bilaterally, normal work of breathing GI: soft, nontender, nondistended, + BS MS: no deformity or atrophy  Skin: warm and dry, no rash Neuro:  Strength and sensation are intact Psych: euthymic mood, full affect   EKG:  EKG  is not ordered today.    Recent Labs: 06/20/2022: ALT 21    Lipid Panel    Component Value Date/Time   CHOL 145 06/20/2022 0724   TRIG 77 06/20/2022 0724   HDL 44 06/20/2022 0724   CHOLHDL 3.3 06/20/2022 0724   LDLCALC 86 06/20/2022 0724      Wt Readings from Last 3 Encounters:  06/25/22 225 lb (102.1 kg)  05/07/22 225 lb (102.1 kg)  06/19/21 222 lb 9.6 oz (101 kg)           No data to display            ASSESSMENT AND PLAN:  1.  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  There was no evidence of atherosclerosis.  I refilled cilostazol. Symptoms are overall mild and not lifestyle limiting.    2. Obstructive sleep apnea: On CPAP.   3.  Hyperlipidemia: His lipid profile in December showed an LDL of 152.  Since then, he was started on atorvastatin 10 mg daily.  I reviewed the results of his recent lipid profile which showed significant improvement in LDL which was down to 86.  This is an excellent response.   Disposition:   FU with me in 1 year  Signed,  Kathlyn Sacramento, MD  06/25/2022 10:50 AM    [place]

## 2022-06-25 NOTE — Patient Instructions (Addendum)
Medication Instructions:  No changes *If you need a refill on your cardiac medications before your next appointment, please call your pharmacy*   Lab Work: None ordered If you have labs (blood work) drawn today and your tests are completely normal, you will receive your results only by: MyChart Message (if you have MyChart) OR A paper copy in the mail If you have any lab test that is abnormal or we need to change your treatment, we will call you to review the results.   Testing/Procedures: None ordered   Follow-Up: At [facility], you and your health needs are our priority.  As part of our continuing mission to provide you with exceptional heart care, we have created designated Provider Care Teams.  These Care Teams include your primary Cardiologist (physician) and Advanced Practice Providers (APPs -  Physician Assistants and Nurse Practitioners) who all work together to provide you with the care you need, when you need it.  We recommend signing up for the patient portal called "MyChart".  Sign up information is provided on this After Visit Summary.  MyChart is used to connect with patients for Virtual Visits (Telemedicine).  Patients are able to view lab/test results, encounter notes, upcoming appointments, etc.  Non-urgent messages can be sent to your provider as well.   To learn more about what you can do with MyChart, go to [email or web address].    Your next appointment:   12 month(s)  Provider:   Dr. Arida 

## 2022-06-26 ENCOUNTER — Telehealth: Payer: Self-pay

## 2022-06-26 NOTE — Telephone Encounter (Signed)
Normal results reviewed with patient who verbalizes understanding. Results forwarded to PCP.

## 2022-06-26 NOTE — Telephone Encounter (Signed)
-----   Message from Sueanne Margarita, MD sent at 06/23/2022 12:20 PM EST ----- Lipids at goal continue current therapy and forward to PCP

## 2022-07-19 ENCOUNTER — Ambulatory Visit: Payer: BC Managed Care – PPO | Admitting: Cardiology

## 2023-02-10 ENCOUNTER — Encounter: Payer: Self-pay | Admitting: Cardiology

## 2023-02-11 MED ORDER — AMOXICILLIN 500 MG PO CAPS: ORAL_CAPSULE | ORAL | 2 refills | Status: DC

## 2023-05-05 ENCOUNTER — Other Ambulatory Visit: Payer: Self-pay | Admitting: Cardiology

## 2023-05-23 ENCOUNTER — Encounter: Payer: Self-pay | Admitting: Cardiology

## 2023-05-27 ENCOUNTER — Other Ambulatory Visit: Payer: Self-pay

## 2023-05-27 DIAGNOSIS — G4733 Obstructive sleep apnea (adult) (pediatric): Secondary | ICD-10-CM

## 2023-05-27 NOTE — Progress Notes (Addendum)
 Order placed for ResMed Airmini Travel CPAP, patient notified. All questions were answered and patient verbalized understanding. Patient will print order to submit online and if unable, patient will come in to office to pick up order for Travel CPAP.

## 2023-05-28 NOTE — Addendum Note (Signed)
 Addended by: Brunetta Genera on: 05/28/2023 10:43 AM   Modules accepted: Orders

## 2023-07-08 ENCOUNTER — Encounter: Payer: Self-pay | Admitting: Cardiovascular Disease

## 2023-07-08 ENCOUNTER — Ambulatory Visit: Payer: 59 | Attending: Cardiovascular Disease | Admitting: Cardiovascular Disease

## 2023-07-08 VITALS — BP 120/76 | HR 75 | Ht 68.0 in | Wt 223.6 lb

## 2023-07-08 DIAGNOSIS — G4733 Obstructive sleep apnea (adult) (pediatric): Secondary | ICD-10-CM | POA: Diagnosis not present

## 2023-07-08 DIAGNOSIS — I739 Peripheral vascular disease, unspecified: Secondary | ICD-10-CM

## 2023-07-08 DIAGNOSIS — E785 Hyperlipidemia, unspecified: Secondary | ICD-10-CM

## 2023-07-08 LAB — COMPREHENSIVE METABOLIC PANEL
ALT: 23 [IU]/L (ref 0–44)
AST: 15 [IU]/L (ref 0–40)
Albumin: 4.2 g/dL (ref 3.8–4.9)
Alkaline Phosphatase: 65 [IU]/L (ref 44–121)
BUN/Creatinine Ratio: 12 (ref 9–20)
BUN: 13 mg/dL (ref 6–24)
Bilirubin Total: 0.4 mg/dL (ref 0.0–1.2)
CO2: 24 mmol/L (ref 20–29)
Calcium: 10.5 mg/dL — ABNORMAL HIGH (ref 8.7–10.2)
Chloride: 104 mmol/L (ref 96–106)
Creatinine, Ser: 1.12 mg/dL (ref 0.76–1.27)
Globulin, Total: 2.2 g/dL (ref 1.5–4.5)
Glucose: 146 mg/dL — ABNORMAL HIGH (ref 70–99)
Potassium: 4.4 mmol/L (ref 3.5–5.2)
Sodium: 141 mmol/L (ref 134–144)
Total Protein: 6.4 g/dL (ref 6.0–8.5)
eGFR: 78 mL/min/{1.73_m2} (ref >59)

## 2023-07-08 LAB — LIPID PANEL
Chol/HDL Ratio: 3.1 {ratio} (ref 0.0–5.0)
Cholesterol, Total: 146 mg/dL (ref 100–199)
HDL: 47 mg/dL (ref >39)
LDL Chol Calc (NIH): 79 mg/dL (ref 0–99)
Triglycerides: 113 mg/dL (ref 0–149)
VLDL Cholesterol Cal: 20 mg/dL (ref 5–40)

## 2023-07-08 LAB — CBC
Hematocrit: 51.7 % — ABNORMAL HIGH (ref 37.5–51.0)
Hemoglobin: 17.3 g/dL (ref 13.0–17.7)
MCH: 30.2 pg (ref 26.6–33.0)
MCHC: 33.5 g/dL (ref 31.5–35.7)
MCV: 90 fL (ref 79–97)
Platelets: 238 10*3/uL (ref 150–450)
RBC: 5.72 x10E6/uL (ref 4.14–5.80)
RDW: 12.8 % (ref 11.6–15.4)
WBC: 6.8 10*3/uL (ref 3.4–10.8)

## 2023-07-08 MED ORDER — CILOSTAZOL 50 MG PO TABS: 50.0000 mg | ORAL_TABLET | Freq: Two times a day (BID) | ORAL | 3 refills | Status: AC

## 2023-07-08 NOTE — Progress Notes (Signed)
 Cardiology Office Note   Date:  07/08/2023   ID:  Jackson Blackburn, DOB [birth year], MRN [id number]  PCP:  Darrow Bussing, MD  Cardiologist: Dr. Louie Boston, MD   No chief complaint on file.     History of Present Illness: Jackson Blackburn is a 53 y.o. male who presents for a follow-up visit regarding  claudication. He has known history of VSD repair in 1974. He was 53 years old at that time and required catheterization via the right femoral artery. He has a scar in that area. Other medical problems include obesity and obstructive sleep apnea.  He was seen for right calf claudication in 2016. He underwent noninvasive evaluation which showed normal ABI bilaterally. 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 Angiography in 01/2015 showed flush occlusion of the right SFA with reconstitution in the midsegment. Extensive collaterals from the profunda. There was overall no significant atherosclerosis noted.  The SFA occlusion was felt to be traumatic from previous surgery and catheterizations when he was 53 years old.   He continues to work at [employer].    Cardiac CTA in December 2020 showed coronary calcium score of 0 no evidence of obstructive disease.  He has been doing well and denies any chest pain or shortness of breath.  He has minimal right calf claudication.  He has been tolerating atorvastatin with no issues.   Past Surgical History:  Procedure Laterality Date   no surgical hx     PERIPHERAL VASCULAR CATHETERIZATION N/A 02/01/2015   Procedure: Abdominal Aortogram;  Surgeon: Iran Ouch, MD;  Location: [facility] INVAS[facility];  Service: Cardiovascular;  Laterality: N/A;     Current Outpatient Medications  Medication Sig Dispense Refill   acetaminophen (TYLENOL) 325 MG tablet Take 650 mg by mouth every 6 (six) hours as needed (For pain).     amoxicillin (AMOXIL) 500 MG capsule Take FOUR tablets (2000 mg) by mouth one hour prior to any  dental procedures 4 capsule 2   aspirin EC 81 MG tablet Take 81 mg by mouth as needed. Swallow whole.     atorvastatin (LIPITOR) 10 MG tablet TAKE 1 TABLET(10 MG) BY MOUTH DAILY 90 tablet 0   cilostazol (PLETAL) 50 MG tablet Take 1 tablet (50 mg total) by mouth 2 (two) times daily. 180 tablet 3   Multiple Vitamin (MULTIVITAMIN) tablet Take 1 tablet by mouth daily.     No current facility-administered medications for this visit.    Allergies:   Penicillins    Social History:  The patient  reports that he has never smoked. He has never used smokeless tobacco. He reports current alcohol use. He reports that he does not use drugs.   Family History:  The patient's family history includes CVA in his father; Coronary artery disease in his father and paternal uncle; Heart disease in his father and paternal uncle; Hypertension in his father.    ROS:  Please see the history of present illness.   Otherwise, review of systems are positive for none.   All other systems are reviewed and negative.    PHYSICAL EXAM: VS:  BP 120/76 (BP Location: Left Arm, Patient Position: Sitting)   Pulse 75   Ht 5\' 8"  (1.727 m)   Wt 223 lb 9.6 oz (101.4 kg)   SpO2 96%   BMI 34.00 kg/m  , BMI Body mass index is 34 kg/m. GEN: Well nourished, well developed, in no acute distress  HEENT:  normal  Neck: no JVD, carotid bruits, or masses Cardiac: RRR; no murmurs, rubs, or gallops,no edema  Respiratory:  clear to auscultation bilaterally, normal work of breathing GI: soft, nontender, nondistended, + BS MS: no deformity or atrophy  Skin: warm and dry, no rash Neuro:  Strength and sensation are intact Psych: euthymic mood, full affect   EKG:  EKG is ordered today. EKG showed: Normal sinus rhythm Possible Left atrial enlargement RSR' or QR pattern in V1 suggests right ventricular conduction delay Cannot rule out Anterior infarct , age undetermined    Recent Labs: No results found for requested labs within  last 365 days.    Lipid Panel    Component Value Date/Time   CHOL 145 06/20/2022 0724   TRIG 77 06/20/2022 0724   HDL 44 06/20/2022 0724   CHOLHDL 3.3 06/20/2022 0724   LDLCALC 86 06/20/2022 0724      Wt Readings from Last 3 Encounters:  07/08/23 223 lb 9.6 oz (101.4 kg)  06/25/22 225 lb (102.1 kg)  05/07/22 225 lb (102.1 kg)           No data to display            ASSESSMENT AND PLAN:  1.  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  There was no evidence of atherosclerosis.  I refilled cilostazol. Symptoms are overall mild and not lifestyle limiting.    2. Obstructive sleep apnea: On CPAP.   3.  Hyperlipidemia: Previous LDL was 152 but improved to 86 with atorvastatin 10 mg daily.  He has not had any labs done this year and I requested routine labs including CMP, lipid profile and CBC.   Disposition:   FU with me in 1 year  Signed,  Lorine Bears, MD  07/08/2023 8:19 AM    [facility]

## 2023-07-08 NOTE — Patient Instructions (Signed)
 Medication Instructions:  No changes *If you need a refill on your cardiac medications before your next appointment, please call your pharmacy*   Lab Work: Your provider would like for you to have the following labs today: CBC, CMET and Lipid  If you have labs (blood work) drawn today and your tests are completely normal, you will receive your results only by: MyChart Message (if you have MyChart) OR A paper copy in the mail If you have any lab test that is abnormal or we need to change your treatment, we will call you to review the results.   Testing/Procedures: None ordered   Follow-Up: At [facility], you and your health needs are our priority.  As part of our continuing mission to provide you with exceptional heart care, we have created designated Provider Care Teams.  These Care Teams include your primary Cardiologist (physician) and Advanced Practice Providers (APPs -  Physician Assistants and Nurse Practitioners) who all work together to provide you with the care you need, when you need it.  We recommend signing up for the patient portal called "MyChart".  Sign up information is provided on this After Visit Summary.  MyChart is used to connect with patients for Virtual Visits (Telemedicine).  Patients are able to view lab/test results, encounter notes, upcoming appointments, etc.  Non-urgent messages can be sent to your provider as well.   To learn more about what you can do with MyChart, go to [email or web address].    Your next appointment:   12 month(s)  Provider:   Dr. Kirke Corin

## 2023-07-09 ENCOUNTER — Ambulatory Visit: Payer: 59 | Admitting: Cardiology

## 2023-08-04 ENCOUNTER — Ambulatory Visit: Attending: Cardiology | Admitting: Cardiology

## 2023-08-04 ENCOUNTER — Encounter: Payer: Self-pay | Admitting: Cardiology

## 2023-08-04 ENCOUNTER — Other Ambulatory Visit: Payer: Self-pay

## 2023-08-04 VITALS — BP 128/82 | HR 90 | Ht 67.0 in | Wt 220.0 lb

## 2023-08-04 DIAGNOSIS — I739 Peripheral vascular disease, unspecified: Secondary | ICD-10-CM | POA: Diagnosis not present

## 2023-08-04 DIAGNOSIS — G4733 Obstructive sleep apnea (adult) (pediatric): Secondary | ICD-10-CM | POA: Diagnosis not present

## 2023-08-04 DIAGNOSIS — E7800 Pure hypercholesterolemia, unspecified: Secondary | ICD-10-CM

## 2023-08-04 DIAGNOSIS — R079 Chest pain, unspecified: Secondary | ICD-10-CM

## 2023-08-04 DIAGNOSIS — Q210 Ventricular septal defect: Secondary | ICD-10-CM

## 2023-08-04 MED ORDER — ATORVASTATIN CALCIUM 20 MG PO TABS: 20.0000 mg | ORAL_TABLET | Freq: Every day | ORAL | 3 refills | Status: DC

## 2023-08-04 NOTE — Progress Notes (Signed)
 Date:  08/04/2023   ID:  Jackson Blackburn, DOB 03/29/1971, MRN [id number]  PCP:  Darrow Bussing, MD  Sleep Medicine:  Armanda Magic, MD Electrophysiologist:  None   Chief Complaint:  OSA  History of Present Illness:    Jackson Blackburn is a 53 y.o. male with a hx of OSA on BIPAP therapy and VSD s/p repair as a child. Coronary CTA was done for chest pain which showed a Coronary Ca score of 0 and no CAD.  There was a small residual VSD with L>R shunting.  LVF was low normal.  He has a hx of occluded SFA felt related to prior surgery for VSD with no evidence of PAD and is on cilostazol.    He is here today for followup and is doing well.  He denies any chest pain or pressure, SOB, DOE (except with walking a lot and unchanged), PND, orthopnea, LE edema, dizziness, palpitations or syncope. He is compliant with his meds and is tolerating meds with no SE.    He is doing well with his PAP device and thinks that he has gotten used to it.  He tolerates the mask and feels the pressure is adequate.  He does use a travel CPAP when he goes on trips. Since going on PAP he feels rested in the am and has no significant daytime sleepiness.  He denies any significant mouth or nasal dryness or nasal congestion.  He does not think that he snores.    Prior CV studies:   The following studies were reviewed today:  PAP compliance download, EKG  Past Medical History:  Diagnosis Date   OSA (obstructive sleep apnea)    Severe   VSD (ventricular septal defect)    Congenital   Past Surgical History:  Procedure Laterality Date   no surgical hx     PERIPHERAL VASCULAR CATHETERIZATION N/A 02/01/2015   Procedure: Abdominal Aortogram;  Surgeon: Iran Ouch, MD;  Location: [facility];  Service: Cardiovascular;  Laterality: N/A;     Current Meds  Medication Sig   acetaminophen (TYLENOL) 325 MG tablet Take 650 mg by mouth every 6 (six) hours as needed (For pain).   amoxicillin (AMOXIL) 500 MG capsule  Take FOUR tablets (2000 mg) by mouth one hour prior to any dental procedures   aspirin EC 81 MG tablet Take 81 mg by mouth daily. Swallow whole.   atorvastatin (LIPITOR) 10 MG tablet TAKE 1 TABLET(10 MG) BY MOUTH DAILY   cilostazol (PLETAL) 50 MG tablet Take 1 tablet (50 mg total) by mouth 2 (two) times daily.   Multiple Vitamin (MULTIVITAMIN) tablet Take 1 tablet by mouth daily.     Allergies:   Penicillins   Social History   Tobacco Use   Smoking status: Never   Smokeless tobacco: Never  Vaping Use   Vaping status: Never Used  Substance Use Topics   Alcohol use: Yes    Comment: rare   Drug use: No     Family Hx: The patient's family history includes CVA in his father; Coronary artery disease in his father and paternal uncle; Heart disease in his father and paternal uncle; Hypertension in his father.  ROS:   Please see the history of present illness.     All other systems reviewed and are negative.   Labs/Other Tests and Data Reviewed:    Recent Labs: 07/08/2023: ALT 23; BUN 13; Creatinine, Ser 1.12; Hemoglobin 17.3; Platelets 238; Potassium 4.4; Sodium 141  Recent Lipid Panel Lab Results  Component Value Date/Time   CHOL 146 07/08/2023 08:30 AM   TRIG 113 07/08/2023 08:30 AM   HDL 47 07/08/2023 08:30 AM   CHOLHDL 3.1 07/08/2023 08:30 AM   LDLCALC 79 07/08/2023 08:30 AM    Wt Readings from Last 3 Encounters:  08/04/23 220 lb (99.8 kg)  07/08/23 223 lb 9.6 oz (101.4 kg)  06/25/22 225 lb (102.1 kg)     Objective:    Vital Signs:  BP 128/82   Pulse 90   Ht 5\' 7"  (1.702 m)   Wt 220 lb (99.8 kg)   SpO2 95%   BMI 34.46 kg/m    GEN: Well nourished, well developed in no acute distress HEENT: Normal NECK: No JVD; No carotid bruits LYMPHATICS: No lymphadenopathy CARDIAC:RRR, no murmurs, rubs, gallops RESPIRATORY:  Clear to auscultation without rales, wheezing or rhonchi  ABDOMEN: Soft, non-tender, non-distended MUSCULOSKELETAL:  No edema; No deformity   SKIN: Warm and dry NEUROLOGIC:  Alert and oriented x 3 PSYCHIATRIC:  Normal affect   ASSESSMENT & PLAN:    OSA - The patient is tolerating PAP therapy well without any problems. The PAP download performed by his DME was personally reviewed and interpreted by me today and showed an AHI of 2/hr on  BiPAP at 12/8 cm H2O with 90% compliance in using more than 4 hours nightly.  The patient has been using and benefiting from PAP use and will continue to benefit from therapy.   VSD -s/p repair as a child -2D echo 04/2021 showed low normal LVF with EF 50-55% with no residual VSD -coronary CTA showed a small VSD with L>R shunt -repeat 2D echo  PVD -vascular angiography 2016 showed Flush occlusion of the right SFA (damaged during repair of VSD) with reconstitution in the midsegment. Collaterals from the profunda. The origin of the SFA was not clear on angiography. There was no evidence of atherosclerosis in the rest of the arteries are completely normal -he sporadically will have some leg pain but not like it was and significantly improved -Continue prescription drug management with Cilostazol 50 mg twice daily, atorvastatin 10 mg daily and aspirin 81 mg daily with as needed refills -followed Dr. Kirke Corin  History of atypical CP -he has had this for years and coronary CTA in 2020 with no Ca and no CAD -No ischemia on ETT 06/08/2022  HLD -LDL goal < 100 -I have personally reviewed and interpreted outside labs performed by patient's PCP which showed LDL 79, HDL 47 and triglycerides 113, ALT 23 on 07/08/2023 -Increase Atorvastatin to 20 mg daily -Repeat FLP and ALT in 6 weeks   Medication Adjustments/Labs and Tests Ordered: Current medicines are reviewed at length with the patient today.  Concerns regarding medicines are outlined above.  Tests Ordered: No orders of the defined types were placed in this encounter.   Medication Changes: No orders of the defined types were placed in this  encounter.    Disposition:  Follow up in 1 year(s)  Signed, Armanda Magic, MD  08/04/2023 11:54 AM    [facility]

## 2023-08-04 NOTE — Addendum Note (Signed)
 Addended by: Frutoso Schatz on: 08/04/2023 11:59 AM   Modules accepted: Orders

## 2023-08-04 NOTE — Patient Instructions (Addendum)
 Medication Instructions:  Your physician has recommended you make the following change in your medication:  1) INCREASE atorvastatin to 20 mg daily  *If you need a refill on your cardiac medications before your next appointment, please call your pharmacy*  Lab Work: IN 6 WEEKS: Fasting lipids and ALT  Testing/Procedures: Echocardiogram Your physician has requested that you have an echocardiogram. Echocardiography is a painless test that uses sound waves to create images of your heart. It provides your doctor with information about the size and shape of your heart and how well your heart's chambers and valves are working. This procedure takes approximately one hour. There are no restrictions for this procedure. Please do NOT wear cologne, perfume, aftershave, or lotions (deodorant is allowed). Please arrive 15 minutes prior to your appointment time.  Please note: We ask at that you not bring children with you during ultrasound (echo/ vascular) testing. Due to room size and safety concerns, children are not allowed in the ultrasound rooms during exams. Our front office staff cannot provide observation of children in our lobby area while testing is being conducted. An adult accompanying a patient to their appointment will only be allowed in the ultrasound room at the discretion of the ultrasound technician under special circumstances. We apologize for any inconvenience.  Follow-Up: At [facility], you and your health needs are our priority.  As part of our continuing mission to provide you with exceptional heart care, we have created designated Provider Care Teams.  These Care Teams include your primary Cardiologist (physician) and Advanced Practice Providers (APPs -  Physician Assistants and Nurse Practitioners) who all work together to provide you with the care you need, when you need it.  Your next appointment:   1 year  Provider:   Armanda Magic, MD      You may go to any of these  [facility] locations:   [facility] - [address] ([facility]) - [address] [phone] [address]   [place] - [address]    [place]  - [address]    [place] - [address] - [address] [employer]  - [address] - [address] ([facility])  [place]   - [address]     1st Floor: - Lobby - Registration  - Pharmacy  - Lab - Cafe  2nd Floor: - PV Lab - Diagnostic Testing (echo, CT, nuclear med)  3rd Floor: - Vacant  4th Floor: - TCTS (cardiothoracic surgery) - AFib Clinic - Structural Heart Clinic - Vascular Surgery  - Vascular Ultrasound  5th Floor: - [facility] (general and EP) - Clinical Pharmacy for coumadin, hypertension, lipid, weight-loss medications, and med management appointments    Valet parking services will be available as well.

## 2023-08-19 ENCOUNTER — Ambulatory Visit: Payer: 59 | Admitting: Cardiology

## 2023-08-19 DIAGNOSIS — I2510 Atherosclerotic heart disease of native coronary artery without angina pectoris: Secondary | ICD-10-CM

## 2023-08-19 HISTORY — DX: Atherosclerotic heart disease of native coronary artery without angina pectoris: I25.10

## 2023-08-26 ENCOUNTER — Ambulatory Visit (HOSPITAL_COMMUNITY): Attending: Cardiology

## 2023-08-26 DIAGNOSIS — G4733 Obstructive sleep apnea (adult) (pediatric): Secondary | ICD-10-CM | POA: Insufficient documentation

## 2023-08-26 DIAGNOSIS — I739 Peripheral vascular disease, unspecified: Secondary | ICD-10-CM | POA: Diagnosis present

## 2023-08-26 DIAGNOSIS — Q210 Ventricular septal defect: Secondary | ICD-10-CM | POA: Insufficient documentation

## 2023-08-26 DIAGNOSIS — E7800 Pure hypercholesterolemia, unspecified: Secondary | ICD-10-CM | POA: Diagnosis present

## 2023-08-26 DIAGNOSIS — R079 Chest pain, unspecified: Secondary | ICD-10-CM | POA: Diagnosis present

## 2023-08-26 LAB — ECHOCARDIOGRAM COMPLETE
Area-P 1/2: 3.49 cm2
S' Lateral: 2.7 cm

## 2023-08-26 MED ORDER — PERFLUTREN LIPID MICROSPHERE
1.0000 mL | INTRAVENOUS | Status: AC | PRN
  Administered 2023-08-26: 4 mL via INTRAVENOUS

## 2023-09-05 ENCOUNTER — Other Ambulatory Visit (HOSPITAL_COMMUNITY): Payer: Self-pay

## 2023-09-05 ENCOUNTER — Telehealth: Payer: Self-pay

## 2023-09-05 DIAGNOSIS — E785 Hyperlipidemia, unspecified: Secondary | ICD-10-CM

## 2023-09-05 DIAGNOSIS — I739 Peripheral vascular disease, unspecified: Secondary | ICD-10-CM

## 2023-09-05 DIAGNOSIS — R079 Chest pain, unspecified: Secondary | ICD-10-CM

## 2023-09-05 DIAGNOSIS — E7800 Pure hypercholesterolemia, unspecified: Secondary | ICD-10-CM

## 2023-09-05 DIAGNOSIS — G4733 Obstructive sleep apnea (adult) (pediatric): Secondary | ICD-10-CM

## 2023-09-05 DIAGNOSIS — Q210 Ventricular septal defect: Secondary | ICD-10-CM

## 2023-09-05 MED ORDER — METOPROLOL TARTRATE 100 MG PO TABS
100.0000 mg | ORAL_TABLET | Freq: Once | ORAL | 0 refills | Status: AC
  Filled 2023-09-05: qty 1, 1d supply, fill #0

## 2023-09-05 NOTE — Telephone Encounter (Signed)
 Left message for patient to call back

## 2023-09-05 NOTE — Telephone Encounter (Signed)
 Called patient back about message. Patient aware of results. Placed order for CT. Sent instructions to patient's mychart.

## 2023-09-05 NOTE — Telephone Encounter (Signed)
Follow Up:      Patient is returning call from today. 

## 2023-09-05 NOTE — Telephone Encounter (Signed)
-----   Message from Gaylyn Keas sent at 08/26/2023  4:59 PM EDT ----- 2D echo showed overall normal LV function EF 55 to 60% with 1 small area in the very tip of the heart called the apex which had abnormal motion.  There is no evidence of residual VSD after repair.  Left atrium is mildly enlarged.  Mild calcification of the aortic valve but no aortic valve stenosis.  There is been no mention of an apical wall motion normality on prior echo so I would like him to have a repeat coronary CT to rule out CAD.  His last coronary CTA was done In 2020 and showed no evidence of CAD he

## 2023-09-16 ENCOUNTER — Encounter: Payer: Self-pay | Admitting: Cardiology

## 2023-09-16 ENCOUNTER — Ambulatory Visit (HOSPITAL_COMMUNITY)
Admission: RE | Admit: 2023-09-16 | Discharge: 2023-09-16 | Disposition: A | Discharge status: Home / Self Care | Source: Ambulatory Visit | Attending: Internal Medicine | Admitting: Internal Medicine

## 2023-09-16 DIAGNOSIS — G4733 Obstructive sleep apnea (adult) (pediatric): Secondary | ICD-10-CM | POA: Insufficient documentation

## 2023-09-16 DIAGNOSIS — Q210 Ventricular septal defect: Secondary | ICD-10-CM | POA: Diagnosis present

## 2023-09-16 DIAGNOSIS — E785 Hyperlipidemia, unspecified: Secondary | ICD-10-CM | POA: Insufficient documentation

## 2023-09-16 DIAGNOSIS — R079 Chest pain, unspecified: Secondary | ICD-10-CM | POA: Insufficient documentation

## 2023-09-16 DIAGNOSIS — I739 Peripheral vascular disease, unspecified: Secondary | ICD-10-CM | POA: Diagnosis not present

## 2023-09-16 DIAGNOSIS — I2510 Atherosclerotic heart disease of native coronary artery without angina pectoris: Secondary | ICD-10-CM | POA: Diagnosis not present

## 2023-09-16 DIAGNOSIS — E7800 Pure hypercholesterolemia, unspecified: Secondary | ICD-10-CM | POA: Insufficient documentation

## 2023-09-16 MED ORDER — IOHEXOL 350 MG/ML SOLN
100.0000 mL | Freq: Once | INTRAVENOUS | Status: AC | PRN
  Administered 2023-09-16: 100 mL via INTRAVENOUS

## 2023-09-16 MED ORDER — METOPROLOL TARTRATE 5 MG/5ML IV SOLN: 10.0000 mg | Freq: Once | INTRAVENOUS | Status: DC | PRN

## 2023-09-16 MED ORDER — NITROGLYCERIN 0.4 MG SL SUBL
0.8000 mg | SUBLINGUAL_TABLET | Freq: Once | SUBLINGUAL | Status: AC
Start: 2023-09-16 — End: 2023-09-16
  Administered 2023-09-16: 0.8 mg via SUBLINGUAL

## 2023-09-16 MED ORDER — NITROGLYCERIN 0.4 MG SL SUBL
SUBLINGUAL_TABLET | SUBLINGUAL | Status: AC
  Filled 2023-09-16: qty 2

## 2023-09-16 MED ORDER — DILTIAZEM HCL 25 MG/5ML IV SOLN: 10.0000 mg | INTRAVENOUS | Status: DC | PRN

## 2023-09-17 ENCOUNTER — Telehealth: Payer: Self-pay

## 2023-09-17 DIAGNOSIS — Z79899 Other long term (current) drug therapy: Secondary | ICD-10-CM

## 2023-09-17 DIAGNOSIS — E785 Hyperlipidemia, unspecified: Secondary | ICD-10-CM

## 2023-09-17 NOTE — Telephone Encounter (Signed)
-----   Message from Gaylyn Keas sent at 09/17/2023  2:59 PM EDT ----- Noncardiac portion of coronary CTA showed a small hiatal hernia

## 2023-09-17 NOTE — Telephone Encounter (Signed)
 Spoke with pt regarding his results. Pt aware of results. Pt had not increased the atorvastatin  to 40 mg but now plans to. Pt will report to [facility] in 6 weeks for labs. FLP and ALT ordered and released. Pt verbalized understanding. All questions if any were answered.

## 2023-10-02 ENCOUNTER — Ambulatory Visit: Payer: Self-pay

## 2023-10-29 LAB — LIPID PANEL
Chol/HDL Ratio: 3.3 ratio (ref 0.0–5.0)
Cholesterol, Total: 137 mg/dL (ref 100–199)
HDL: 41 mg/dL (ref >39)
LDL Chol Calc (NIH): 79 mg/dL (ref 0–99)
Triglycerides: 90 mg/dL (ref 0–149)
VLDL Cholesterol Cal: 17 mg/dL (ref 5–40)

## 2023-10-29 LAB — ALT: ALT: 40 IU/L (ref 0–44)

## 2023-10-30 ENCOUNTER — Ambulatory Visit: Payer: Self-pay

## 2023-11-03 ENCOUNTER — Telehealth: Payer: Self-pay

## 2023-11-03 DIAGNOSIS — E785 Hyperlipidemia, unspecified: Secondary | ICD-10-CM

## 2023-11-03 DIAGNOSIS — Z79899 Other long term (current) drug therapy: Secondary | ICD-10-CM

## 2023-11-03 DIAGNOSIS — I2510 Atherosclerotic heart disease of native coronary artery without angina pectoris: Secondary | ICD-10-CM

## 2023-11-03 MED ORDER — ATORVASTATIN CALCIUM 80 MG PO TABS: 80.0000 mg | ORAL_TABLET | Freq: Every day | ORAL | 3 refills | Status: AC

## 2023-11-03 NOTE — Telephone Encounter (Signed)
 [facility] message to advise orders placed to increase atorvastatin  dose to 80 mg and then recheck  labs at the end of the July to see if the numbers have improved.

## 2024-02-17 ENCOUNTER — Encounter: Payer: Self-pay | Admitting: Cardiology

## 2024-02-17 MED ORDER — AMOXICILLIN 500 MG PO CAPS: ORAL_CAPSULE | ORAL | 2 refills | Status: AC
